# Patient Record
Sex: Male | Born: 1935 | Race: White | Hispanic: No | Marital: Married | Smoking: Former smoker
Health system: Southern US, Community
[De-identification: ages and names within clinical notes are randomized; demographics above are authoritative.]

## PROBLEM LIST (undated history)

## (undated) DIAGNOSIS — R030 Elevated blood-pressure reading, without diagnosis of hypertension: Secondary | ICD-10-CM

## (undated) DIAGNOSIS — E119 Type 2 diabetes mellitus without complications: Secondary | ICD-10-CM

## (undated) DIAGNOSIS — K219 Gastro-esophageal reflux disease without esophagitis: Secondary | ICD-10-CM

## (undated) DIAGNOSIS — Z8711 Personal history of peptic ulcer disease: Secondary | ICD-10-CM

## (undated) DIAGNOSIS — I10 Essential (primary) hypertension: Secondary | ICD-10-CM

## (undated) DIAGNOSIS — Z8719 Personal history of other diseases of the digestive system: Secondary | ICD-10-CM

## (undated) DIAGNOSIS — R930 Abnormal findings on diagnostic imaging of skull and head, not elsewhere classified: Secondary | ICD-10-CM

## (undated) DIAGNOSIS — F329 Major depressive disorder, single episode, unspecified: Secondary | ICD-10-CM

## (undated) DIAGNOSIS — E785 Hyperlipidemia, unspecified: Secondary | ICD-10-CM

## (undated) DIAGNOSIS — R3129 Other microscopic hematuria: Secondary | ICD-10-CM

## (undated) HISTORY — DX: Abnormal findings on diagnostic imaging of skull and head, not elsewhere classified: R93.0

## (undated) HISTORY — DX: Elevated blood-pressure reading, without diagnosis of hypertension: R03.0

## (undated) HISTORY — DX: Personal history of other diseases of the digestive system: Z87.19

## (undated) HISTORY — DX: Gastro-esophageal reflux disease without esophagitis: K21.9

## (undated) HISTORY — DX: Hyperlipidemia, unspecified: E78.5

## (undated) HISTORY — DX: Other microscopic hematuria: R31.29

## (undated) HISTORY — PX: EYE SURGERY: SHX253

## (undated) HISTORY — DX: Personal history of peptic ulcer disease: Z87.11

## (undated) HISTORY — DX: Major depressive disorder, single episode, unspecified: F32.9

## (undated) HISTORY — DX: Type 2 diabetes mellitus without complications: E11.9

---

## 2008-11-09 ENCOUNTER — Ambulatory Visit: Payer: Self-pay | Admitting: Family Medicine

## 2008-11-09 DIAGNOSIS — F329 Major depressive disorder, single episode, unspecified: Secondary | ICD-10-CM

## 2008-11-09 DIAGNOSIS — F3289 Other specified depressive episodes: Secondary | ICD-10-CM

## 2008-11-09 DIAGNOSIS — IMO0002 Reserved for concepts with insufficient information to code with codable children: Secondary | ICD-10-CM | POA: Insufficient documentation

## 2008-11-09 DIAGNOSIS — E1165 Type 2 diabetes mellitus with hyperglycemia: Secondary | ICD-10-CM | POA: Insufficient documentation

## 2008-11-09 DIAGNOSIS — E119 Type 2 diabetes mellitus without complications: Secondary | ICD-10-CM

## 2008-11-09 HISTORY — DX: Other specified depressive episodes: F32.89

## 2008-11-09 HISTORY — DX: Major depressive disorder, single episode, unspecified: F32.9

## 2008-11-09 HISTORY — DX: Type 2 diabetes mellitus without complications: E11.9

## 2008-11-10 ENCOUNTER — Telehealth: Payer: Self-pay | Admitting: *Deleted

## 2008-11-10 LAB — CONVERTED CEMR LAB: Hgb A1c MFr Bld: 6.3 % (ref 4.6–6.5)

## 2009-02-09 ENCOUNTER — Ambulatory Visit: Payer: Self-pay | Admitting: Family Medicine

## 2009-02-14 LAB — CONVERTED CEMR LAB
Creatinine,U: 61 mg/dL
Hgb A1c MFr Bld: 6.7 % — ABNORMAL HIGH (ref 4.6–6.5)
Microalb Creat Ratio: 3.3 mg/g (ref 0.0–30.0)
Microalb, Ur: 0.2 mg/dL (ref 0.0–1.9)

## 2009-09-07 ENCOUNTER — Ambulatory Visit: Payer: Self-pay | Admitting: Family Medicine

## 2009-09-07 LAB — HM DIABETES FOOT EXAM

## 2009-09-10 LAB — CONVERTED CEMR LAB: Hgb A1c MFr Bld: 6.6 % — ABNORMAL HIGH (ref 4.6–6.5)

## 2009-11-16 ENCOUNTER — Ambulatory Visit (HOSPITAL_COMMUNITY): Admission: RE | Admit: 2009-11-16 | Discharge: 2009-11-16 | Payer: Self-pay | Admitting: Family Medicine

## 2009-11-16 ENCOUNTER — Ambulatory Visit: Payer: Self-pay | Admitting: Family Medicine

## 2009-11-16 DIAGNOSIS — R05 Cough: Secondary | ICD-10-CM

## 2009-11-16 DIAGNOSIS — R3129 Other microscopic hematuria: Secondary | ICD-10-CM | POA: Insufficient documentation

## 2009-11-16 DIAGNOSIS — R059 Cough, unspecified: Secondary | ICD-10-CM | POA: Insufficient documentation

## 2009-11-16 DIAGNOSIS — R300 Dysuria: Secondary | ICD-10-CM | POA: Insufficient documentation

## 2009-11-16 DIAGNOSIS — J13 Pneumonia due to Streptococcus pneumoniae: Secondary | ICD-10-CM | POA: Insufficient documentation

## 2009-11-16 HISTORY — DX: Other microscopic hematuria: R31.29

## 2009-11-16 LAB — CONVERTED CEMR LAB
Nitrite: NEGATIVE
Specific Gravity, Urine: 1.015
WBC Urine, dipstick: NEGATIVE
pH: 5

## 2009-11-17 LAB — CONVERTED CEMR LAB
Basophils Absolute: 0 10*3/uL (ref 0.0–0.1)
Lymphocytes Relative: 10.1 % — ABNORMAL LOW (ref 12.0–46.0)
Lymphs Abs: 1.3 10*3/uL (ref 0.7–4.0)
Monocytes Relative: 11.8 % (ref 3.0–12.0)
Platelets: 230 10*3/uL (ref 150.0–400.0)
RDW: 15 % — ABNORMAL HIGH (ref 11.5–14.6)

## 2009-11-19 ENCOUNTER — Ambulatory Visit: Payer: Self-pay | Admitting: Family Medicine

## 2009-11-19 LAB — CONVERTED CEMR LAB
Bilirubin Urine: NEGATIVE
Glucose, Urine, Semiquant: NEGATIVE
Nitrite: NEGATIVE
Urobilinogen, UA: 0.2
WBC Urine, dipstick: NEGATIVE
pH: 5

## 2009-12-10 ENCOUNTER — Encounter: Payer: Self-pay | Admitting: Family Medicine

## 2009-12-10 DIAGNOSIS — R930 Abnormal findings on diagnostic imaging of skull and head, not elsewhere classified: Secondary | ICD-10-CM

## 2009-12-10 HISTORY — DX: Abnormal findings on diagnostic imaging of skull and head, not elsewhere classified: R93.0

## 2009-12-13 ENCOUNTER — Ambulatory Visit: Payer: Self-pay | Admitting: Family Medicine

## 2010-02-25 ENCOUNTER — Encounter: Payer: Self-pay | Admitting: *Deleted

## 2010-05-17 ENCOUNTER — Other Ambulatory Visit: Payer: Self-pay | Admitting: Family Medicine

## 2010-05-17 ENCOUNTER — Ambulatory Visit
Admission: RE | Admit: 2010-05-17 | Discharge: 2010-05-17 | Payer: Self-pay | Source: Home / Self Care | Attending: Family Medicine | Admitting: Family Medicine

## 2010-05-17 LAB — BASIC METABOLIC PANEL
BUN: 18 mg/dL (ref 6–23)
CO2: 28 mEq/L (ref 19–32)
Calcium: 9.7 mg/dL (ref 8.4–10.5)
Chloride: 105 mEq/L (ref 96–112)
Creatinine, Ser: 1.1 mg/dL (ref 0.4–1.5)
GFR: 68.16 mL/min (ref >60.00)
Glucose, Bld: 133 mg/dL — ABNORMAL HIGH (ref 70–99)
Potassium: 5.1 mEq/L (ref 3.5–5.1)
Sodium: 141 mEq/L (ref 135–145)

## 2010-05-17 LAB — LIPID PANEL
Cholesterol: 185 mg/dL (ref 0–200)
HDL: 37.5 mg/dL — ABNORMAL LOW (ref >39.00)
LDL Cholesterol: 116 mg/dL — ABNORMAL HIGH (ref 0–99)
Total CHOL/HDL Ratio: 5
Triglycerides: 156 mg/dL — ABNORMAL HIGH (ref 0.0–149.0)
VLDL: 31.2 mg/dL (ref 0.0–40.0)

## 2010-05-17 LAB — URINALYSIS
Bilirubin Urine: NEGATIVE
Hemoglobin, Urine: NEGATIVE
Ketones, ur: NEGATIVE
Leukocytes, UA: NEGATIVE
Nitrite: NEGATIVE
Specific Gravity, Urine: 1.02 (ref 1.000–1.030)
Total Protein, Urine: NEGATIVE
Urine Glucose: NEGATIVE
Urobilinogen, UA: 0.2 (ref 0.0–1.0)
pH: 6.5 (ref 5.0–8.0)

## 2010-05-17 LAB — HEPATIC FUNCTION PANEL
ALT: 25 U/L (ref 0–53)
AST: 24 U/L (ref 0–37)
Albumin: 4.4 g/dL (ref 3.5–5.2)
Alkaline Phosphatase: 86 U/L (ref 39–117)
Bilirubin, Direct: 0.2 mg/dL (ref 0.0–0.3)
Total Bilirubin: 1.2 mg/dL (ref 0.3–1.2)
Total Protein: 7.4 g/dL (ref 6.0–8.3)

## 2010-05-17 LAB — CBC WITH DIFFERENTIAL/PLATELET
Basophils Absolute: 0 10*3/uL (ref 0.0–0.1)
Basophils Relative: 0.5 % (ref 0.0–3.0)
Eosinophils Absolute: 0.1 10*3/uL (ref 0.0–0.7)
Eosinophils Relative: 1.3 % (ref 0.0–5.0)
HCT: 45.7 % (ref 39.0–52.0)
Hemoglobin: 15.8 g/dL (ref 13.0–17.0)
Lymphocytes Relative: 25.9 % (ref 12.0–46.0)
Lymphs Abs: 2.3 10*3/uL (ref 0.7–4.0)
MCHC: 34.5 g/dL (ref 30.0–36.0)
MCV: 92.9 fl (ref 78.0–100.0)
Monocytes Absolute: 0.7 10*3/uL (ref 0.1–1.0)
Monocytes Relative: 8.3 % (ref 3.0–12.0)
Neutro Abs: 5.7 10*3/uL (ref 1.4–7.7)
Neutrophils Relative %: 64 % (ref 43.0–77.0)
Platelets: 235 10*3/uL (ref 150.0–400.0)
RBC: 4.92 Mil/uL (ref 4.22–5.81)
RDW: 13.7 % (ref 11.5–14.6)
WBC: 8.9 10*3/uL (ref 4.5–10.5)

## 2010-05-17 LAB — PSA: PSA: 9.93 ng/mL — ABNORMAL HIGH (ref 0.10–4.00)

## 2010-05-17 LAB — TSH: TSH: 5 u[IU]/mL (ref 0.35–5.50)

## 2010-05-24 ENCOUNTER — Ambulatory Visit
Admission: RE | Admit: 2010-05-24 | Discharge: 2010-05-24 | Payer: Self-pay | Source: Home / Self Care | Attending: Family Medicine | Admitting: Family Medicine

## 2010-05-24 DIAGNOSIS — E785 Hyperlipidemia, unspecified: Secondary | ICD-10-CM

## 2010-05-24 HISTORY — DX: Hyperlipidemia, unspecified: E78.5

## 2010-05-27 ENCOUNTER — Telehealth: Payer: Self-pay | Admitting: Family Medicine

## 2010-05-28 NOTE — Assessment & Plan Note (Signed)
Summary: ROA/FUP/AS PER PT INSTRUCTION SHEET/RCD   Vital Signs:  Patient profile:   77 year old male Weight:      165 pounds Temp:     98.6 degrees F oral BP sitting:   124 / 70  (left arm) Cuff size:   regular CC: fu per instructions, src   History of Present Illness: Patient presented late last week with chills, malaise, and body sweats. No fever. Clinically determined that he had likely community-acquired pneumonia left lower lobe. This was picked up on exam. Chest x-ray confirmed also had elevated white count with elevated neutrophils. Patient on Avelox 400 mg daily and feels some better. No fever. Chills are resolving. Good fluid intake. No nausea or vomiting.  Had some hematuria on dipstick last week but no pyuria. No gross hematuria by history. No history of kidney stones.  Current Medications (verified): 1)  Glimepiride 4 Mg Tabs (Glimepiride) .... Once Daily 2)  Aspirin 81 Mg Tbec (Aspirin) .... Once Daily 3)  Tagamet Hb 200 Mg Tabs (Cimetidine) .... Once Daily 4)  Centrum Silver  Tabs (Multiple Vitamins-Minerals) .... Once Daily 5)  Cod Liver Oil  Caps (Cod Liver Oil) .... Once Daily 6)  Miralax  Powd (Polyethylene Glycol 3350) .... Daily  Allergies (verified): No Known Drug Allergies  Past History:  Past Medical History: Last updated: 11/09/2008 Depression Type 2 DM  Physical Exam  General:  Well-developed,well-nourished,in no acute distress; alert,appropriate and cooperative throughout examination Ears:  External ear exam shows no significant lesions or deformities.  Otoscopic examination reveals clear canals, tympanic membranes are intact bilaterally without bulging, retraction, inflammation or discharge. Hearing is grossly normal bilaterally. Mouth:  Oral mucosa and oropharynx without lesions or exudates.  Teeth in good repair. Neck:  No deformities, masses, or tenderness noted. Lungs:  patient has more pronounced crackles and rales left base today right side  is clear. No wheezing Heart:  normal rate and regular rhythm.   Extremities:  no edema Neurologic:  alert & oriented X3.     Impression & Recommendations:  Problem # 1:  PNEUMONIA, COMMUNITY ACQUIRED, PNEUMOCOCCAL (ICD-481) Assessment Improved will order repeat CXR in 3-4 weeks to reassess.  Clinically some better today.  Problem # 2:  MICROSCOPIC HEMATURIA (ICD-599.72) repeat urine dipstick again today reveals small blood. His significant other works in urology office and she will have repeat urine done in 2 weeks and if persistent at that point urology referral  Complete Medication List: 1)  Glimepiride 4 Mg Tabs (Glimepiride) .... Once daily 2)  Aspirin 81 Mg Tbec (Aspirin) .... Once daily 3)  Tagamet Hb 200 Mg Tabs (Cimetidine) .... Once daily 4)  Centrum Silver Tabs (Multiple vitamins-minerals) .... Once daily 5)  Cod Liver Oil Caps (Cod liver oil) .... Once daily 6)  Miralax Powd (Polyethylene glycol 3350) .... Daily  Patient Instructions: 1)  Repeat urinalysis in 2 weeks. 2)  We will schedule follow up CXR in 3-4 weeks.  Laboratory Results   Urine Tests  Date/Time Received: November 19, 2009   Routine Urinalysis   Color: yellow Appearance: Clear Glucose: negative   (Normal Range: Negative) Bilirubin: negative   (Normal Range: Negative) Ketone: negative   (Normal Range: Negative) Spec. Gravity: >=1.030   (Normal Range: 1.003-1.035) Blood: small   (Normal Range: Negative) pH: 5.0   (Normal Range: 5.0-8.0) Protein: negative   (Normal Range: Negative) Urobilinogen: 0.2   (Normal Range: 0-1) Nitrite: negative   (Normal Range: Negative) Leukocyte Esterace: negative   (Normal Range:  Negative)    Comments: Kathrynn Speed CMA  November 19, 2009 5:26 PM

## 2010-05-28 NOTE — Assessment & Plan Note (Signed)
Summary: body aches/sweats/chills/cjr   Vital Signs:  Patient profile:   77 year old male Weight:      167 pounds O2 Sat:      99 % on Room air Temp:     98.1 degrees F oral Pulse rate:   74 / minute Pulse rhythm:   regular BP sitting:   130 / 80  (left arm) Cuff size:   regular  Vitals Entered By: Sid Falcon LPN (November 16, 2009 9:47 AM)  O2 Flow:  Room air CC: body aches, sweats, chills   History of Present Illness: Same-day appointment. Onset 2 days ago of body sweats, chills, body aches and decreased appetite. No definite fever. Had some mostly nonproductive cough. No recent headache or sore throat. No nasal congestion. Has noticed some urine frequency and foul odor to urine. No burning with urination. Generalized weakness.  Type 2 diabetes and blood sugars have been somewhat elevated past couple days. He had a couple of readings over 200 which is unusual for him. Recent A1c 6.6%.  Allergies (verified): No Known Drug Allergies  Past History:  Past Medical History: Last updated: 11/09/2008 Depression Type 2 DM  Family History: Last updated: 11/09/2008 unrevealing  Social History: Last updated: 11/09/2008 Retired Married Alcohol use-yes Past smoker  Risk Factors: Exercise: yes (09/07/2009) PMH-FH-SH reviewed for relevance  Review of Systems       The patient complains of anorexia.  The patient denies weight loss, chest pain, syncope, dyspnea on exertion, peripheral edema, prolonged cough, hemoptysis, abdominal pain, hematochezia, severe indigestion/heartburn, hematuria, incontinence, and suspicious skin lesions.    Physical Exam  General:  Well-developed,well-nourished,in no acute distress; alert,appropriate and cooperative throughout examination Eyes:  nonicteric. Ears:  External ear exam shows no significant lesions or deformities.  Otoscopic examination reveals clear canals, tympanic membranes are intact bilaterally without bulging, retraction,  inflammation or discharge. Hearing is grossly normal bilaterally. Nose:  External nasal examination shows no deformity or inflammation. Nasal mucosa are pink and moist without lesions or exudates. Mouth:  Oral mucosa and oropharynx without lesions or exudates.  Teeth in good repair. Neck:  No deformities, masses, or tenderness noted. Lungs:  patient has rales left base which are a new finding. No wheezes or retractions. Heart:  normal rate and regular rhythm.   Abdomen:  Bowel sounds positive,abdomen soft and non-tender without masses, organomegaly or hernias noted. Extremities:  no edema Neurologic:  alert & oriented X3 and cranial nerves II-XII intact.   Skin:  no rash Cervical Nodes:  No lymphadenopathy noted   Impression & Recommendations:  Problem # 1:  COUGH (ICD-786.2)  clinically suspicious left lower lobe pneumonia. Obtain CBC and chest x-ray and will start Avelox 400 mg daily for 10 days with samples given  Orders: T-2 View CXR (71020TC) Venipuncture ([address]) Specimen Handling (53664) TLB-CBC Platelet - w/Differential (85025-CBCD)  Problem # 2:  DYSURIA (ICD-788.1) pt has hematuria but no pyuria.  UTI unlikely.  Will need follow up UA to make sure blood cells clearing. Orders: UA Dipstick w/o Micro (manual) (40347)  Problem # 3:  DIAB W/O COMP TYPE II/UNS NOT STATED UNCNTRL (ICD-250.00) possibly exacerbated sec to PNA. His updated medication list for this problem includes:    Glimepiride 4 Mg Tabs (Glimepiride) ..... Once daily    Aspirin 81 Mg Tbec (Aspirin) ..... Once daily  Problem # 4:  MICROSCOPIC HEMATURIA (ICD-599.72) follow up next week and repeat then.  Complete Medication List: 1)  Glimepiride 4 Mg Tabs (Glimepiride) .... Once  daily 2)  Aspirin 81 Mg Tbec (Aspirin) .... Once daily 3)  Tagamet Hb 200 Mg Tabs (Cimetidine) .... Once daily 4)  Centrum Silver Tabs (Multiple vitamins-minerals) .... Once daily 5)  Cod Liver Oil Caps (Cod liver oil) .... Once  daily 6)  Miralax Powd (Polyethylene glycol 3350) .... Daily  Patient Instructions: 1)  start Avelox 400 mg one tablet daily 2)  Drink plenty of fluids 3)  Follow up immediately if you develop any increased shortness of breath, recurrent vomiting, or any other worsening symptoms 4)  Schedule followup visit in 3 days  Laboratory Results   Urine Tests    Routine Urinalysis   Color: orange Glucose: negative   (Normal Range: Negative) Bilirubin: negative   (Normal Range: Negative) Ketone: negative   (Normal Range: Negative) Spec. Gravity: 1.015   (Normal Range: 1.003-1.035) Blood: moderate   (Normal Range: Negative) pH: 5.0   (Normal Range: 5.0-8.0) Protein: negative   (Normal Range: Negative) Urobilinogen: 0.2   (Normal Range: 0-1) Nitrite: negative   (Normal Range: Negative) Leukocyte Esterace: negative   (Normal Range: Negative)    Comments: Sid Falcon LPN  November 16, 2009 10:26 AM

## 2010-05-28 NOTE — Miscellaneous (Signed)
Summary: Orders Update  Clinical Lists Changes  Problems: Added new problem of ABNORMAL CHEST XRAY (ICD-793.1) Orders: Added new Test order of T-2 View CXR (71020TC) - Signed  Appended Document: Orders Update Message left on pt personally identified home VM to have his 4 week repeat CXR done today.  Appended Document: Orders Update Spoke with pt, he will have this done.

## 2010-05-28 NOTE — Miscellaneous (Signed)
Summary: Immunization preload, Flu A [facility]   Immunization History:  Influenza Immunization History:    Influenza:  historical (02/24/2010)

## 2010-05-28 NOTE — Assessment & Plan Note (Signed)
Summary: fu on dm/njr   Vital Signs:  Patient profile:   77 year old male Weight:      170 pounds Temp:     98.2 degrees F oral BP sitting:   120 / 70  (left arm) Cuff size:   regular  Vitals Entered By: Sid Falcon LPN (Sep 07, 2009 2:44 PM) CC: DM follow-up Is Patient Diabetic? Yes Did you bring your meter with you today? Yes   History of Present Illness: Followup type 2 diabetes. Log of home readings reviewed. Mostly low 100s. No hypoglycemic symptoms. Amaryl 4 mg daily. States quite active with yard work and gardening.  Diabetes Management History:      He has not been enrolled in the "Diabetic Education Program".  He states understanding of dietary principles and is following his diet appropriately.  No sensory loss is reported.  Self foot exams are being performed.  He is checking home blood sugars.  He says that he is exercising.        Hypoglycemic symptoms are not occurring.  No hyperglycemic symptoms are reported.        There are no symptoms to suggest diabetic complications.  No changes have been made to his treatment plan since last visit.    Preventive Screening-Counseling & Management  Caffeine-Diet-Exercise     Does Patient Exercise: yes  Allergies (verified): No Known Drug Allergies  Social History: Does Patient Exercise:  yes  Review of Systems  The patient denies anorexia, weight loss, weight gain, chest pain, syncope, dyspnea on exertion, and abdominal pain.    Diabetes Management Exam:    Foot Exam (with socks and/or shoes not present):       Sensory-Pinprick/Light touch:          Left medial foot (L-4): normal          Left dorsal foot (L-5): normal          Left lateral foot (S-1): normal          Right medial foot (L-4): normal          Right dorsal foot (L-5): normal          Right lateral foot (S-1): normal       Sensory-Monofilament:          Left foot: normal          Right foot: normal       Inspection:          Left foot: normal         Right foot: normal       Nails:          Left foot: normal          Right foot: normal    Eye Exam:       Eye Exam done elsewhere          Date: 06/27/2009          Results: normal   Impression & Recommendations:  Problem # 1:  DIAB W/O COMP TYPE II/UNS NOT STATED UNCNTRL (ICD-250.00)  His updated medication list for this problem includes:    Glimepiride 4 Mg Tabs (Glimepiride) ..... Once daily    Aspirin 81 Mg Tbec (Aspirin) ..... Once daily  Orders: Prescription Created Electronically [phone]) Venipuncture [phone]) TLB-A1C / Hgb A1C (Glycohemoglobin) (83036-A1C)  Complete Medication List: 1)  Glimepiride 4 Mg Tabs (Glimepiride) .... Once daily 2)  Aspirin 81 Mg Tbec (Aspirin) .... Once daily 3)  Tagamet Hb 200  Mg Tabs (Cimetidine) .... Once daily 4)  Centrum Silver Tabs (Multiple vitamins-minerals) .... Once daily 5)  Cod Liver Oil Caps (Cod liver oil) .... Once daily 6)  Miralax Powd (Polyethylene glycol 3350) .... Daily  Patient Instructions: 1)  It is important that you exercise reguarly at least 20 minutes 5 times a week. If you develop chest pain, have severe difficulty breathing, or feel very tired, stop exercising immediately and seek medical attention.  2)  Check your blood sugars regularly. If your readings are usually above: 140 or below 70 you should contact our office.  3)  It is important that your diabetic A1c level is checked every 3 months.  4)  See your eye doctor yearly to check for diabetic eye damage. 5)  Check your feet each night  for sore areas, calluses or signs of infection.  Prescriptions: GLIMEPIRIDE 4 MG TABS (GLIMEPIRIDE) once daily  #30 x 11   Entered and Authorized by:   Evelena Peat MD   Signed by:   Evelena Peat MD on 09/07/2009   Method used:   [facility]ronically to        [phone][facility] (retail)       [address]       [place], [place]  [address]       Ph: [phone] or [phone]       Fax: [phone]   RxID:    [id number]

## 2010-05-30 NOTE — Assessment & Plan Note (Signed)
Summary: CPX//SLM   Vital Signs:  Patient profile:   77 year old male Height:      69 inches Weight:      175 pounds Temp:     98.0 degrees F oral BP sitting:   130 / 88  (left arm) Cuff size:   regular  Vitals Entered By: Sid Falcon LPN (May 24, 2010 3:30 PM)  History of Present Illness: Patient here for complete physical.  He has history of type 2 diabetes and past history of depression which is currently stable off medication. Takes glymepiride 4 mg daily for diabetes. Blood sugars been stable. No symptoms of hyperglycemia. does have some occasional nocturia and slow stream but is not interested in further medications. No burning with urination.  No history of prior colonoscopy screening. He is not interested in further colonoscopy but does agree to Hemoccult. Tetanus up-to-date. Flu vaccine given. Prior Pneumovax.  Clinical Review Panels:  Immunizations   Last Tetanus Booster:  Historical (04/28/2005)   Last Flu Vaccine:  Historical (02/24/2010)   Last Pneumovax:  Historical (04/28/2005)   Allergies (verified): No Known Drug Allergies  Past History:  Family History: Last updated: 11/09/2008 unrevealing  Social History: Last updated: 05/24/2010 Retired Married Alcohol use-yes Past smoker, quit age 10  Risk Factors: Exercise: yes (09/07/2009)  Past Medical History: Depression Type 2 DM Hyperlipidemia  Past Surgical History: none PMH-FH-SH reviewed for relevance  Social History: Retired Married Alcohol use-yes Past smoker, quit age 24  Review of Systems  The patient denies anorexia, fever, weight loss, weight gain, vision loss, decreased hearing, hoarseness, chest pain, syncope, dyspnea on exertion, peripheral edema, prolonged cough, headaches, hemoptysis, abdominal pain, melena, hematochezia, severe indigestion/heartburn, hematuria, incontinence, genital sores, muscle weakness, suspicious skin lesions, transient blindness, difficulty walking,  depression, unusual weight change, abnormal bleeding, enlarged lymph nodes, and testicular masses.    Physical Exam  General:  Well-developed,well-nourished,in no acute distress; alert,appropriate and cooperative throughout examination Head:  normocephalic and atraumatic.   Eyes:  No corneal or conjunctival inflammation noted. EOMI. Perrla. Funduscopic exam benign, without hemorrhages, exudates or papilledema. Vision grossly normal. Ears:  External ear exam shows no significant lesions or deformities.  Otoscopic examination reveals clear canals, tympanic membranes are intact bilaterally without bulging, retraction, inflammation or discharge. Hearing is grossly normal bilaterally. Mouth:  Oral mucosa and oropharynx without lesions or exudates.  Teeth in good repair. Neck:  No deformities, masses, or tenderness noted. Lungs:  Normal respiratory effort, chest expands symmetrically. Lungs are clear to auscultation, no crackles or wheezes. Heart:  normal rate and regular rhythm.   Abdomen:  Bowel sounds positive,abdomen soft and non-tender without masses, organomegaly or hernias noted. Prostate:  pt refuses Msk:  No deformity or scoliosis noted of thoracic or lumbar spine.   Extremities:  No clubbing, cyanosis, edema, or deformity noted with normal full range of motion of all joints.   Neurologic:  alert & oriented X3, cranial nerves II-XII intact, and strength normal in all extremities.   Skin:  no rashes and no suspicious lesions.   Cervical Nodes:  No lymphadenopathy noted Psych:  Cognition and judgment appear intact. Alert and cooperative with normal attention span and concentration. No apparent delusions, illusions, hallucinations   Impression & Recommendations:  Problem # 1:  Preventive Health Care (ICD-V70.0) labs reviewed with pt.  He has elevated PSA.  This was drawn with his labs and we did not have an opportunity to discuss wtih  him in advance pros and cons of PSA  testing in 77 year  old.  He is fully aware this could represent cancer or benign cause such as BPH.  After long discussion he has decided not to have any evaluation as he would not be interested in any  treatment.  He will notify us if he changes his mind.  I would not rec any other PSA testing at his age.  Problem # 2:  DIAB W/O COMP TYPE II/UNS NOT STATED UNCNTRL (ICD-250.00)  His updated medication list for this problem includes:    Glimepiride 4 Mg Tabs (Glimepiride) ..... Once daily    Aspirin 81 Mg Tbec (Aspirin) ..... Once daily  Problem # 3:  HYPERLIPIDEMIA (ICD-272.4) initiate lipitor in this diabetic with LDL 116 and f/u labs scheduled. His updated medication list for this problem includes:    Lipitor 10 Mg Tabs (Atorvastatin calcium) ..... One by mouth once daily  Complete Medication List: 1)  Glimepiride 4 Mg Tabs (Glimepiride) .... Once daily 2)  Aspirin 81 Mg Tbec (Aspirin) .... Once daily 3)  Tagamet Hb 200 Mg Tabs (Cimetidine) .... Once daily 4)  Centrum Silver Tabs (Multiple vitamins-minerals) .... Once daily 5)  Cod Liver Oil Caps (Cod liver oil) .... Once daily 6)  Miralax Powd (Polyethylene glycol 3350) .... Daily 7)  Lipitor 10 Mg Tabs (Atorvastatin calcium) .... One by mouth once daily  Patient Instructions: 1)  Please schedule a follow-up appointment in 2 months.  2)  See your eye doctor yearly to check for diabetic eye damage. 3)  Check your feet each night  for sore areas, calluses or signs of infection.  4)  Hepatic Panel prior to visit ICD-9: 272.4 5)  Lipid panel prior to visit ICD-9 : 272.4 6)  HgBA1c prior to visit  ICD-9: 250.00 Prescriptions: LIPITOR 10 MG TABS (ATORVASTATIN CALCIUM) one by mouth once daily  #30 x 5   Entered and Authorized by:   Evelena Peat MD   Signed by:   Evelena Peat MD on 05/24/2010   Method used:   [facility] to        [facility] [phone]* (retail)       [address]       [place], [place]  [address]       Ph: [phone] or  [phone]       Fax: [phone]   RxID:   [id number]    Orders Added: 1)  Est. Patient 65& > [[address]]

## 2010-06-05 NOTE — Progress Notes (Signed)
  Phone Note Call from Patient Call back at Home Phone [phone]   Caller: Spouse Call For: Adrian Peat MD Reason for Call: Privacy/Consent Authorization Summary of Call: [facility] [place] [facility] Wife wants to know if they can use Pravastatin instead of generic Lipitor.   Price difference is 100.00.   Initial call taken by: Lynann Beaver CMA AAMA,  May 27, 2010 9:45 AM  Follow-up for Phone Call        yes.  Let's start Pravachol 20 mg daily and repeat lipids and hepatic 6-8 weeks. Follow-up by: Adrian Peat MD,  May 27, 2010 10:44 AM    New/Updated Medications: PRAVACHOL 20 MG TABS (PRAVASTATIN SODIUM) one by mouth daily Prescriptions: PRAVACHOL 20 MG TABS (PRAVASTATIN SODIUM) one by mouth daily  #90 x 0   Entered by:   Lynann Beaver CMA AAMA   Authorized by:   Adrian Peat MD   Signed by:   Lynann Beaver CMA AAMA on 05/27/2010   Method used:   [facility] to        [facility] [phone]* (retail)       [address]       [place], [place]  [address]       Ph: [phone] or [phone]       Fax: [phone]   RxID:   [id number]  Notified pt.

## 2010-06-21 ENCOUNTER — Other Ambulatory Visit (INDEPENDENT_AMBULATORY_CARE_PROVIDER_SITE_OTHER): Payer: Self-pay | Admitting: Family Medicine

## 2010-06-21 DIAGNOSIS — Z0000 Encounter for general adult medical examination without abnormal findings: Secondary | ICD-10-CM

## 2010-06-21 LAB — HEMOCCULT GUIAC POC 1CARD (OFFICE): Card #2 Fecal Occult Blod, POC: NEGATIVE

## 2010-06-26 NOTE — Progress Notes (Signed)
Quick Note:  Pt wife informed ______ 

## 2010-06-27 ENCOUNTER — Other Ambulatory Visit: Payer: Self-pay

## 2010-07-23 ENCOUNTER — Ambulatory Visit: Payer: Self-pay | Admitting: Family Medicine

## 2010-08-14 ENCOUNTER — Other Ambulatory Visit (INDEPENDENT_AMBULATORY_CARE_PROVIDER_SITE_OTHER): Payer: Self-pay | Admitting: Family Medicine

## 2010-08-14 DIAGNOSIS — Z0289 Encounter for other administrative examinations: Secondary | ICD-10-CM

## 2010-08-14 DIAGNOSIS — IMO0001 Reserved for inherently not codable concepts without codable children: Secondary | ICD-10-CM

## 2010-08-14 DIAGNOSIS — E785 Hyperlipidemia, unspecified: Secondary | ICD-10-CM

## 2010-08-14 DIAGNOSIS — T887XXA Unspecified adverse effect of drug or medicament, initial encounter: Secondary | ICD-10-CM

## 2010-08-14 LAB — LIPID PANEL
LDL Cholesterol: 83 mg/dL (ref 0–99)
Total CHOL/HDL Ratio: 4
VLDL: 38.4 mg/dL (ref 0.0–40.0)

## 2010-08-14 LAB — HEPATIC FUNCTION PANEL
ALT: 21 U/L (ref 0–53)
AST: 21 U/L (ref 0–37)
Alkaline Phosphatase: 81 U/L (ref 39–117)
Total Bilirubin: 0.8 mg/dL (ref 0.3–1.2)

## 2010-08-14 LAB — HEMOGLOBIN A1C: Hgb A1c MFr Bld: 7.2 % — ABNORMAL HIGH (ref 4.6–6.5)

## 2010-08-16 ENCOUNTER — Encounter: Payer: Self-pay | Admitting: Family Medicine

## 2010-08-21 ENCOUNTER — Encounter: Payer: Self-pay | Admitting: Family Medicine

## 2010-08-21 ENCOUNTER — Ambulatory Visit (INDEPENDENT_AMBULATORY_CARE_PROVIDER_SITE_OTHER): Payer: Self-pay | Admitting: Family Medicine

## 2010-08-21 DIAGNOSIS — E785 Hyperlipidemia, unspecified: Secondary | ICD-10-CM

## 2010-08-21 DIAGNOSIS — E119 Type 2 diabetes mellitus without complications: Secondary | ICD-10-CM

## 2010-08-21 MED ORDER — PRAVASTATIN SODIUM 20 MG PO TABS: 20.0000 mg | ORAL_TABLET | Freq: Every day | ORAL | Status: DC

## 2010-08-21 MED ORDER — GLIMEPIRIDE 4 MG PO TABS: 4.0000 mg | ORAL_TABLET | Freq: Every day | ORAL | Status: DC

## 2010-08-21 NOTE — Progress Notes (Signed)
  Subjective:    Patient ID: Adrian Wood, male    DOB: [birth year], 77 y.o.   MRN: [id number]  HPI Patient is seen for medical followup. Type 2 diabetes with recent A1c 7.2%. Takes Amaryl 4 mg daily. No hypoglycemic symptoms. No symptoms of hyperglycemia. Getting yearly eye exams.  Hyperlipidemia. Recently started Pravachol 20 mg daily. Tolerating without side effects. Recent lipids reviewed. LDL at goal of less than 100.   Review of Systems  Constitutional: Negative for appetite change and unexpected weight change.  Eyes: Negative for visual disturbance.  Respiratory: Negative for cough and shortness of breath.   Cardiovascular: Negative for chest pain, palpitations and leg swelling.  Gastrointestinal: Negative for abdominal pain and abdominal distention.  Genitourinary: Negative for dysuria.       Objective:   Physical Exam  Constitutional: He is oriented to person, place, and time. He appears well-developed and well-nourished. No distress.  Cardiovascular: Normal rate, regular rhythm and normal heart sounds.  Exam reveals no gallop.   Pulmonary/Chest: Effort normal and breath sounds normal. No respiratory distress. He has no wheezes. He has no rales.  Musculoskeletal: He exhibits no edema.       Feet reveal no lesions. Good distal foot pulses. Normal sensory function.  Neurological: He is alert and oriented to person, place, and time.  Psychiatric: He has a normal mood and affect.          Assessment & Plan:  #1 type 2 diabetes fair control. Reassess A1c 6 months. Continue yearly eye exams. Refill medication for one year #2 dyslipidemia. Continue Pravachol 20 mg daily. Reassess in 6 months

## 2011-02-13 ENCOUNTER — Other Ambulatory Visit (INDEPENDENT_AMBULATORY_CARE_PROVIDER_SITE_OTHER): Payer: Self-pay

## 2011-02-13 DIAGNOSIS — E785 Hyperlipidemia, unspecified: Secondary | ICD-10-CM

## 2011-02-13 DIAGNOSIS — IMO0001 Reserved for inherently not codable concepts without codable children: Secondary | ICD-10-CM

## 2011-02-13 LAB — HEPATIC FUNCTION PANEL
AST: 25 U/L (ref 0–37)
Albumin: 4.2 g/dL (ref 3.5–5.2)
Alkaline Phosphatase: 81 U/L (ref 39–117)
Bilirubin, Direct: 0.1 mg/dL (ref 0.0–0.3)
Total Bilirubin: 1.2 mg/dL (ref 0.3–1.2)

## 2011-02-13 LAB — LIPID PANEL
HDL: 39.1 mg/dL (ref >39.00)
LDL Cholesterol: 76 mg/dL (ref 0–99)
Total CHOL/HDL Ratio: 4
VLDL: 39.8 mg/dL (ref 0.0–40.0)

## 2011-02-19 ENCOUNTER — Encounter: Payer: Self-pay | Admitting: Family Medicine

## 2011-02-20 ENCOUNTER — Encounter: Payer: Self-pay | Admitting: Family Medicine

## 2011-02-20 ENCOUNTER — Ambulatory Visit (INDEPENDENT_AMBULATORY_CARE_PROVIDER_SITE_OTHER): Payer: Self-pay | Admitting: Family Medicine

## 2011-02-20 VITALS — BP 130/82 | Temp 98.1°F | Wt 173.0 lb

## 2011-02-20 DIAGNOSIS — M792 Neuralgia and neuritis, unspecified: Secondary | ICD-10-CM

## 2011-02-20 DIAGNOSIS — E785 Hyperlipidemia, unspecified: Secondary | ICD-10-CM

## 2011-02-20 DIAGNOSIS — E119 Type 2 diabetes mellitus without complications: Secondary | ICD-10-CM

## 2011-02-20 DIAGNOSIS — G4700 Insomnia, unspecified: Secondary | ICD-10-CM

## 2011-02-20 DIAGNOSIS — G5790 Unspecified mononeuropathy of unspecified lower limb: Secondary | ICD-10-CM

## 2011-02-20 MED ORDER — AMITRIPTYLINE HCL 10 MG PO TABS: 10.0000 mg | ORAL_TABLET | Freq: Every day | ORAL | Status: DC

## 2011-02-20 MED ORDER — PRAVASTATIN SODIUM 20 MG PO TABS: 20.0000 mg | ORAL_TABLET | Freq: Every day | ORAL | Status: DC

## 2011-02-20 MED ORDER — GLIMEPIRIDE 4 MG PO TABS: 4.0000 mg | ORAL_TABLET | Freq: Every day | ORAL | Status: DC

## 2011-02-20 NOTE — Progress Notes (Signed)
  Subjective:    Patient ID: Adrian Wood, male    DOB: 04/17/1927, 77 y.o.   MRN: [id number]  HPI  Medical followup. Patient has history of type 2 diabetes, past history of depression, hyperlipidemia. Medications reviewed. Compliant with all. Blood sugars mostly taken 2-3 hours postprandial at night and ranging anywhere from 110-200. Recent A1c 7.6%. No hypoglycemia. No symptoms of hyperglycemia. Stays quite active with physical activities at home. No visual changes.  Denies any recent chest pain. Lipids well controlled by recent lab work. These are reviewed with patient.  Bilateral leg pains mostly at night. Aching quality. Moderate severity. No pain with activity. No claudication symptoms. Also complains of some frequent insomnia. No late day use of caffeine. No regular consumption of alcohol.  Past Medical History  Diagnosis Date  . DIAB W/O COMP TYPE II/UNS NOT STATED UNCNTRL 11/09/2008  . DEPRESSION 11/09/2008  . Microscopic hematuria 11/16/2009  . Nonspecific (abnormal) findings on radiological and other examination of body structure 12/10/2009  . ABNORMAL CHEST XRAY 12/10/2009  . HYPERLIPIDEMIA 05/24/2010   No past surgical history on file.  reports that he quit smoking about 28 years ago. His smoking use included Cigarettes. He has a 30 pack-year smoking history. He does not have any smokeless tobacco history on file. His alcohol and drug histories not on file. family history is not on file. No Known Allergies    Review of Systems  Constitutional: Negative for fever, activity change, appetite change, fatigue and unexpected weight change.  HENT: Positive for congestion. Negative for trouble swallowing.   Eyes: Negative for pain and visual disturbance.  Respiratory: Negative for cough, shortness of breath and wheezing.   Cardiovascular: Negative for chest pain and palpitations.  Gastrointestinal: Negative for nausea, vomiting, abdominal pain, diarrhea, constipation, blood in stool,  abdominal distention and rectal pain.  Genitourinary: Negative for dysuria, hematuria and testicular pain.  Skin: Negative for rash.  Neurological: Negative for dizziness, syncope and headaches.  Hematological: Negative for adenopathy.  Psychiatric/Behavioral: Negative for confusion and dysphoric mood.       Objective:   Physical Exam  Constitutional: He is oriented to person, place, and time. He appears well-developed and well-nourished.  HENT:  Mouth/Throat: Oropharynx is clear and moist.  Neck: Neck supple. No thyromegaly present.  Cardiovascular: Normal rate and regular rhythm.   Pulmonary/Chest: Effort normal and breath sounds normal. No respiratory distress. He has no wheezes. He has no rales.  Musculoskeletal: He exhibits no edema.  Lymphadenopathy:    He has no cervical adenopathy.  Neurological: He is alert and oriented to person, place, and time.  Skin: No rash noted.  Psychiatric: He has a normal mood and affect. His behavior is normal.          Assessment & Plan:  #1 type 2 diabetes. A1c 7.6%. We discussed at his age not pushing for tighter control this time based on current data. Reassess A1c is 6 months #2 dyslipidemia. LDL at goal. Refill Pravachol for one year #3 neuropathic leg pain. Low dose Elavil 10 mg each bedtime which may help with insomnia as well #4 insomnia. Sleep hygiene discussed. Low dose Elavil as above with caution about raising dose given his age

## 2011-04-10 ENCOUNTER — Telehealth: Payer: Self-pay | Admitting: Family Medicine

## 2011-04-10 MED ORDER — HYDROCODONE-HOMATROPINE 5-1.5 MG/5ML PO SYRP: 5.0000 mL | ORAL_SOLUTION | Freq: Four times a day (QID) | ORAL | Status: AC | PRN

## 2011-04-10 NOTE — Telephone Encounter (Signed)
Pts wife called to check on status of getting a cough med called in to [facility] on [address] asap today.

## 2011-04-10 NOTE — Telephone Encounter (Signed)
Please advise 

## 2011-04-10 NOTE — Telephone Encounter (Signed)
Pt wife is aware waiting on MD °

## 2011-04-10 NOTE — Telephone Encounter (Signed)
Pts spouse called and said that her spouse has the same symptoms that she had. Cough, chest congestion, low grade fever. Since pt is diabetic, is there anything that pcp could call in to [facility] on [address].

## 2011-04-10 NOTE — Telephone Encounter (Signed)
Informed on home VM Rx called in

## 2011-04-10 NOTE — Telephone Encounter (Signed)
Hycodan cough syrup one tsp po q 6 hours prn cough, disp 120 ml with no refill.

## 2011-08-11 ENCOUNTER — Other Ambulatory Visit: Payer: Self-pay | Admitting: Family Medicine

## 2011-08-14 ENCOUNTER — Other Ambulatory Visit (INDEPENDENT_AMBULATORY_CARE_PROVIDER_SITE_OTHER): Payer: Self-pay

## 2011-08-14 DIAGNOSIS — E119 Type 2 diabetes mellitus without complications: Secondary | ICD-10-CM

## 2011-08-14 LAB — HEMOGLOBIN A1C: Hgb A1c MFr Bld: 7.7 % — ABNORMAL HIGH (ref 4.6–6.5)

## 2011-08-15 NOTE — Progress Notes (Signed)
Quick Note:  Pt informed, has appt next week ______

## 2011-08-21 ENCOUNTER — Ambulatory Visit (INDEPENDENT_AMBULATORY_CARE_PROVIDER_SITE_OTHER): Payer: Self-pay | Admitting: Family Medicine

## 2011-08-21 ENCOUNTER — Encounter: Payer: Self-pay | Admitting: Family Medicine

## 2011-08-21 VITALS — BP 136/76 | Temp 97.4°F | Wt 174.0 lb

## 2011-08-21 DIAGNOSIS — L603 Nail dystrophy: Secondary | ICD-10-CM

## 2011-08-21 DIAGNOSIS — L608 Other nail disorders: Secondary | ICD-10-CM

## 2011-08-21 DIAGNOSIS — E119 Type 2 diabetes mellitus without complications: Secondary | ICD-10-CM

## 2011-08-21 DIAGNOSIS — IMO0001 Reserved for inherently not codable concepts without codable children: Secondary | ICD-10-CM

## 2011-08-21 DIAGNOSIS — M791 Myalgia, unspecified site: Secondary | ICD-10-CM

## 2011-08-21 NOTE — Progress Notes (Signed)
  Subjective:    Patient ID: Adrian Wood, male    DOB: 05-01-1926, 78 y.o.   MRN: [id number]  HPI  Patient for followup of type 2 diabetes. Takes glimepiride 4 mg daily. Fasting blood sugars fairly well controlled. 2 hour postprandials very but usually less than 180. Recent A1c 7.7%. No hypoglycemia. He stays quite active with exercise and activities around the house.  Intermittent bilateral leg aches and muscle aches. No claudication symptoms. Symptoms occur at rest. Question pravastatin related. Denies any specific arthralgias.  Dystrophic changes left great toenail. Noted since trauma several years ago. Wife does try several topical treatments to no avail. No symptoms.  Past Medical History  Diagnosis Date  . DIAB W/O COMP TYPE II/UNS NOT STATED UNCNTRL 11/09/2008  . DEPRESSION 11/09/2008  . Microscopic hematuria 11/16/2009  . Nonspecific (abnormal) findings on radiological and other examination of body structure 12/10/2009  . ABNORMAL CHEST XRAY 12/10/2009  . HYPERLIPIDEMIA 05/24/2010   No past surgical history on file.  reports that he quit smoking about 29 years ago. His smoking use included Cigarettes. He has a 30 pack-year smoking history. He does not have any smokeless tobacco history on file. His alcohol and drug histories not on file. family history is not on file. No Known Allergies   Review of Systems  Constitutional: Negative for appetite change.  HENT: Negative for trouble swallowing and voice change.   Eyes: Negative for visual disturbance.  Respiratory: Negative for cough and shortness of breath.   Cardiovascular: Negative for chest pain, palpitations and leg swelling.  Gastrointestinal: Negative for abdominal pain.  Musculoskeletal: Positive for myalgias. Negative for arthralgias.  Neurological: Negative for dizziness, weakness and headaches.  Hematological: Negative for adenopathy.       Objective:   Physical Exam  Constitutional: He appears well-developed and  well-nourished.  HENT:  Mouth/Throat: Oropharynx is clear and moist.  Neck: Neck supple. No thyromegaly present.  Cardiovascular: Normal rate and regular rhythm.   Pulmonary/Chest: Effort normal and breath sounds normal. No respiratory distress. He has no wheezes. He has no rales.  Musculoskeletal: He exhibits no edema.       Left great toe reveals dystrophic changes with brittle nail  Lymphadenopathy:    He has no cervical adenopathy.          Assessment & Plan:  #1 type 2 diabetes. Marginal control. Discussed options they wish to wait at this point. Recheck A1c in 6 months. If elevating the hhe may still be a candidate for addition of metformin. Has never been on this previously and does not have any major kidney problems  #2 Dystrophic left great toe nail. Suspect onychomycosis. Discussed options including Lamisil at this point they wish to wait. If interested in 6 months liver panel then #3 myalgias bilateral legs. Question statin related. Trial off pravastatin for 3-4 weeks with myalgias resolve he'll be in touch otherwise they will go back on in one month if myalgias not resolving.

## 2011-08-21 NOTE — Patient Instructions (Signed)
Hold Pravastatin for now and be in touch in 3-4 weeks if leg pains improve

## 2011-09-18 ENCOUNTER — Telehealth: Payer: Self-pay | Admitting: Family Medicine

## 2011-09-18 NOTE — Telephone Encounter (Signed)
Would go ahead and start back pravastatin if no improvement in symptoms off this medication for one month

## 2011-09-18 NOTE — Telephone Encounter (Signed)
Patient called stating that the md took him off pravastatin and told him to call within a month to see if he should continue again. Please advise. This is concerning the pains in his legs.

## 2011-09-18 NOTE — Telephone Encounter (Signed)
Pt wife informed

## 2011-09-18 NOTE — Telephone Encounter (Signed)
i called pt re: bil leg discomfort, he reports he has noted no improvement, symptoms with his legs is ongoing.

## 2011-10-02 LAB — HM DIABETES EYE EXAM

## 2011-10-03 ENCOUNTER — Encounter: Payer: Self-pay | Admitting: Family Medicine

## 2012-01-23 ENCOUNTER — Telehealth: Payer: Self-pay | Admitting: Family Medicine

## 2012-01-23 DIAGNOSIS — E119 Type 2 diabetes mellitus without complications: Secondary | ICD-10-CM

## 2012-01-23 DIAGNOSIS — E785 Hyperlipidemia, unspecified: Secondary | ICD-10-CM

## 2012-01-23 NOTE — Telephone Encounter (Signed)
Pls advise.  

## 2012-01-23 NOTE — Telephone Encounter (Signed)
Get lipid, hepatic, and A1C

## 2012-01-23 NOTE — Telephone Encounter (Signed)
Pt is scheduled for 6 month follow-up on 10/25

## 2012-01-23 NOTE — Telephone Encounter (Signed)
Patient's spouse called stating that her husband normally has blood work before seeing the Md and she would like to know if there is even a point of coming to see the MD if he does not have blood work. Please advise.

## 2012-01-23 NOTE — Telephone Encounter (Signed)
Pt wife informed, labs ordered

## 2012-02-16 ENCOUNTER — Other Ambulatory Visit (INDEPENDENT_AMBULATORY_CARE_PROVIDER_SITE_OTHER): Payer: Self-pay

## 2012-02-16 DIAGNOSIS — E785 Hyperlipidemia, unspecified: Secondary | ICD-10-CM

## 2012-02-16 LAB — HEPATIC FUNCTION PANEL
Alkaline Phosphatase: 81 U/L (ref 39–117)
Bilirubin, Direct: 0.2 mg/dL (ref 0.0–0.3)

## 2012-02-17 NOTE — Progress Notes (Signed)
Quick Note:  Pt informed on personally identified VM ______ 

## 2012-02-20 ENCOUNTER — Encounter: Payer: Self-pay | Admitting: Family Medicine

## 2012-02-20 ENCOUNTER — Ambulatory Visit (INDEPENDENT_AMBULATORY_CARE_PROVIDER_SITE_OTHER): Payer: Self-pay | Admitting: Family Medicine

## 2012-02-20 VITALS — BP 138/74 | Temp 97.8°F | Wt 174.0 lb

## 2012-02-20 DIAGNOSIS — IMO0001 Reserved for inherently not codable concepts without codable children: Secondary | ICD-10-CM

## 2012-02-20 DIAGNOSIS — E119 Type 2 diabetes mellitus without complications: Secondary | ICD-10-CM

## 2012-02-20 DIAGNOSIS — E785 Hyperlipidemia, unspecified: Secondary | ICD-10-CM

## 2012-02-20 DIAGNOSIS — R030 Elevated blood-pressure reading, without diagnosis of hypertension: Secondary | ICD-10-CM

## 2012-02-20 LAB — HEPATIC FUNCTION PANEL
Bilirubin, Direct: 0.1 mg/dL (ref 0.0–0.3)
Total Bilirubin: 1 mg/dL (ref 0.3–1.2)
Total Protein: 7.3 g/dL (ref 6.0–8.3)

## 2012-02-20 LAB — LIPID PANEL
HDL: 32.1 mg/dL — ABNORMAL LOW (ref >39.00)
VLDL: 61.8 mg/dL — ABNORMAL HIGH (ref 0.0–40.0)

## 2012-02-20 LAB — HEMOGLOBIN A1C: Hgb A1c MFr Bld: 8.2 % — ABNORMAL HIGH (ref 4.6–6.5)

## 2012-02-20 LAB — BASIC METABOLIC PANEL
BUN: 19 mg/dL (ref 6–23)
Chloride: 102 mEq/L (ref 96–112)
Glucose, Bld: 195 mg/dL — ABNORMAL HIGH (ref 70–99)
Potassium: 5 mEq/L (ref 3.5–5.1)

## 2012-02-20 NOTE — Progress Notes (Signed)
  Subjective:    Patient ID: Adrian Wood, male    DOB: June 20, 1926, 78 y.o.   MRN: [id number]  HPI  Patient seen for medical followup. He has type 2 diabetes and hyperlipidemia. He continues to have some lower extremity muscle aches off and on. Not clearly related pravastatin as these did not improve off pravastatin. Blood sugars relatively stable. Somewhat up and down. Last A1c 7.7%. No hypoglycemia. Takes pravastatin for hyperlipidemia.  No recent chest pains. Stays quite active physically. Already had flu vaccine.  Past Medical History  Diagnosis Date  . DIAB W/O COMP TYPE II/UNS NOT STATED UNCNTRL 11/09/2008  . DEPRESSION 11/09/2008  . Microscopic hematuria 11/16/2009  . Nonspecific (abnormal) findings on radiological and other examination of body structure 12/10/2009  . ABNORMAL CHEST XRAY 12/10/2009  . HYPERLIPIDEMIA 05/24/2010   No past surgical history on file.  reports that he quit smoking about 29 years ago. His smoking use included Cigarettes. He has a 30 pack-year smoking history. He does not have any smokeless tobacco history on file. His alcohol and drug histories not on file. family history is not on file. No Known Allergies    Review of Systems  Constitutional: Negative for fatigue.  Eyes: Negative for visual disturbance.  Respiratory: Negative for cough, chest tightness and shortness of breath.   Cardiovascular: Negative for chest pain, palpitations and leg swelling.  Genitourinary: Negative for dysuria.  Skin: Negative for rash.  Neurological: Negative for dizziness, syncope, weakness, light-headedness and headaches.       Objective:   Physical Exam  Constitutional: He appears well-developed and well-nourished.  Neck: Neck supple. No thyromegaly present.  Cardiovascular: Normal rate and regular rhythm.   Pulmonary/Chest: Effort normal and breath sounds normal. No respiratory distress. He has no wheezes. He has no rales.  Musculoskeletal: He exhibits no edema.     Feet reveal no skin lesions. Good distal foot pulses. Good capillary refill. No calluses. Normal sensation with monofilament testing           Assessment & Plan:  #1 type 2 diabetes. Recheck A1c. History of marginal control.  #2 hyperlipidemia. Recheck lipid and hepatic panel  #3 borderline elevated blood pressure. Check urine microalbumin. If positive consider ACE inhibitor

## 2012-02-23 ENCOUNTER — Other Ambulatory Visit: Payer: Self-pay | Admitting: *Deleted

## 2012-02-23 DIAGNOSIS — E119 Type 2 diabetes mellitus without complications: Secondary | ICD-10-CM

## 2012-02-23 MED ORDER — METFORMIN HCL 500 MG PO TABS: 500.0000 mg | ORAL_TABLET | Freq: Every day | ORAL | Status: DC

## 2012-02-23 NOTE — Progress Notes (Signed)
Quick Note:  Pt informed on home personally identified VM ______ 

## 2012-05-21 ENCOUNTER — Other Ambulatory Visit: Payer: Self-pay | Admitting: *Deleted

## 2012-05-21 ENCOUNTER — Other Ambulatory Visit (INDEPENDENT_AMBULATORY_CARE_PROVIDER_SITE_OTHER): Payer: Self-pay

## 2012-05-21 DIAGNOSIS — E119 Type 2 diabetes mellitus without complications: Secondary | ICD-10-CM

## 2012-05-21 MED ORDER — METFORMIN HCL 500 MG PO TABS: 500.0000 mg | ORAL_TABLET | Freq: Two times a day (BID) | ORAL | Status: DC

## 2012-05-21 NOTE — Progress Notes (Signed)
Quick Note:  Pt informed ______ 

## 2012-06-05 ENCOUNTER — Encounter (HOSPITAL_COMMUNITY): Payer: Self-pay | Admitting: Emergency Medicine

## 2012-06-05 ENCOUNTER — Emergency Department (HOSPITAL_COMMUNITY): Payer: Self-pay

## 2012-06-05 ENCOUNTER — Emergency Department (HOSPITAL_COMMUNITY)
Admission: EM | Admit: 2012-06-05 | Discharge: 2012-06-05 | Disposition: A | Discharge status: Home / Self Care | Payer: Self-pay | Attending: Emergency Medicine | Admitting: Emergency Medicine

## 2012-06-05 DIAGNOSIS — E119 Type 2 diabetes mellitus without complications: Secondary | ICD-10-CM | POA: Insufficient documentation

## 2012-06-05 DIAGNOSIS — Z7982 Long term (current) use of aspirin: Secondary | ICD-10-CM | POA: Insufficient documentation

## 2012-06-05 DIAGNOSIS — Z87891 Personal history of nicotine dependence: Secondary | ICD-10-CM | POA: Insufficient documentation

## 2012-06-05 DIAGNOSIS — Z8639 Personal history of other endocrine, nutritional and metabolic disease: Secondary | ICD-10-CM | POA: Insufficient documentation

## 2012-06-05 DIAGNOSIS — W010XXA Fall on same level from slipping, tripping and stumbling without subsequent striking against object, initial encounter: Secondary | ICD-10-CM | POA: Insufficient documentation

## 2012-06-05 DIAGNOSIS — S52023A Displaced fracture of olecranon process without intraarticular extension of unspecified ulna, initial encounter for closed fracture: Secondary | ICD-10-CM | POA: Insufficient documentation

## 2012-06-05 DIAGNOSIS — Y92009 Unspecified place in unspecified non-institutional (private) residence as the place of occurrence of the external cause: Secondary | ICD-10-CM | POA: Insufficient documentation

## 2012-06-05 DIAGNOSIS — Z8659 Personal history of other mental and behavioral disorders: Secondary | ICD-10-CM | POA: Insufficient documentation

## 2012-06-05 DIAGNOSIS — Z79899 Other long term (current) drug therapy: Secondary | ICD-10-CM | POA: Insufficient documentation

## 2012-06-05 DIAGNOSIS — Y9301 Activity, walking, marching and hiking: Secondary | ICD-10-CM | POA: Insufficient documentation

## 2012-06-05 DIAGNOSIS — Z87448 Personal history of other diseases of urinary system: Secondary | ICD-10-CM | POA: Insufficient documentation

## 2012-06-05 DIAGNOSIS — Z862 Personal history of diseases of the blood and blood-forming organs and certain disorders involving the immune mechanism: Secondary | ICD-10-CM | POA: Insufficient documentation

## 2012-06-05 MED ORDER — HYDROCODONE-ACETAMINOPHEN 5-325 MG PO TABS: 1.0000 | ORAL_TABLET | Freq: Four times a day (QID) | ORAL | Status: DC | PRN

## 2012-06-05 NOTE — ED Provider Notes (Signed)
History    This chart was scribed for non-physician practitioner working with Gerhard Munch, MD by Burman Nieves. This patient was seen in room WTR9/WTR9 and the patient's care was started at 6:11 PM. CSN: [id number]  Arrival date & time 06/05/12  1611      Chief Complaint  Patient presents with  . Elbow Pain    (Consider location/radiation/quality/duration/timing/severity/associated sxs/prior treatment) Patient is a 79 y.o. male presenting with fall. The history is provided by the patient. No language interpreter was used.  Fall The accident occurred 3 to 5 hours ago. The fall occurred while walking. He fell from a height of 1 to 2 ft. He landed on grass. The point of impact was the right elbow. The pain is present in the right elbow. The pain is moderate. He was ambulatory at the scene. There was no entrapment after the fall. There was no drug use involved in the accident. There was no alcohol use involved in the accident. Pertinent negatives include no fever, no nausea and no vomiting. The symptoms are aggravated by activity. He has tried nothing for the symptoms.    Raine Elsass is a 79 y.o. male who presents to the Emergency Department complaining of moderate constant  right elbow pain. Pt was out in his garden  when he slipped and fell on his right elbow earlier today. Pt denies LOC, head injury, fever, chills, cough, nausea, vomiting, diarrhea, SOB, weakness, and any other associated symptoms. He denies any pain medication taken. Other than DM, Pt denies any other medical history.  Past Medical History  Diagnosis Date  . DIAB W/O COMP TYPE II/UNS NOT STATED UNCNTRL 11/09/2008  . DEPRESSION 11/09/2008  . Microscopic hematuria 11/16/2009  . Nonspecific (abnormal) findings on radiological and other examination of body structure 12/10/2009  . ABNORMAL CHEST XRAY 12/10/2009  . HYPERLIPIDEMIA 05/24/2010    No past surgical history on file.  No family history on file.  History   Substance Use Topics  . Smoking status: Former Smoker -- 1.00 packs/day for 30 years    Types: Cigarettes    Quit date: 08/21/1982  . Smokeless tobacco: Never Used  . Alcohol Use: 12.6 oz/week    21 Cans of beer per week      Review of Systems  Constitutional: Negative for fever and chills.  Respiratory: Negative for shortness of breath.   Gastrointestinal: Negative for nausea and vomiting.  Musculoskeletal:       Right elbow pain  Neurological: Negative for syncope and weakness.  All other systems reviewed and are negative.    Allergies  Review of patient's allergies indicates no known allergies.  Home Medications   Current Outpatient Rx  Name  Route  Sig  Dispense  Refill  . aspirin 81 MG tablet   Oral   Take 81 mg by mouth daily.           . cimetidine (TAGAMET) 200 MG tablet   Oral   Take 200 mg by mouth daily.           Marland Kitchen Cod Liver Oil 10 MINIM CAPS   Oral   Take by mouth daily.           Marland Kitchen glimepiride (AMARYL) 4 MG tablet   Oral   Take 1 tablet (4 mg total) by mouth daily before breakfast.   90 tablet   3   . metFORMIN (GLUCOPHAGE) 500 MG tablet   Oral   Take 1 tablet (500 mg total) by mouth  2 (two) times daily with a meal.   180 tablet   3   . Multiple Vitamins-Minerals (MULTIVITAMIN WITH MINERALS) tablet   Oral   Take 1 tablet by mouth daily.           . polyethylene glycol powder (GLYCOLAX/MIRALAX) powder   Oral   Take 17 g by mouth daily. Use as directed            BP 151/81  Pulse 88  Temp(Src) 98.6 F (37 C) (Oral)  Resp 16  SpO2 96%  Physical Exam  Nursing note and vitals reviewed. Constitutional: He is oriented to person, place, and time. He appears well-developed and well-nourished. No distress.  HENT:  Head: Normocephalic and atraumatic.  Eyes: EOM are normal.  Neck: Neck supple. No tracheal deviation present.  Cardiovascular: Normal rate.   Pulses:      Radial pulses are 2+ on the right side, and 2+ on the  left side.  Good radial pulse  Pulmonary/Chest: Effort normal. No respiratory distress.  Musculoskeletal: Normal range of motion. He exhibits tenderness.       Right shoulder: He exhibits normal range of motion, no tenderness and no swelling.       Right wrist: He exhibits normal range of motion, no tenderness and no swelling.  Distal part of humerus is tender to palpation. Swelling and bruising over the right  olecrannon process Patient with full extension of right elbow.  Patient flexion of the right elbow somewhat limited.  Approximately 120 degrees of flexion.   ROM nml for upper right shoulder ROM nml for right wrist   Neurological: He is alert and oriented to person, place, and time. No sensory deficit. Gait normal.  Skin: Skin is warm and dry.  Psychiatric: He has a normal mood and affect. His behavior is normal.    ED Course  Procedures (including critical care time)  DIAGNOSTIC STUDIES: Oxygen Saturation is 96% on room air, adequate by my interpretation.    COORDINATION OF CARE: 6:23 PM Discussed ED treatment with pt and pt agrees. Pt was offered pain medication and refused as of now.    Labs Reviewed - No data to display Dg Elbow Complete Right  06/05/2012  *RADIOLOGY REPORT*  Clinical Data: Fall.  Pain  RIGHT ELBOW - COMPLETE 3+ VIEW  Comparison: None.  Findings: Extensive soft tissue swelling posterior to the elbow. There is an avulsion fracture of the olecranon with displacement.  The elbow joint is normal.  No fracture of the radial head.  There is degenerative spurring of the medial and lateral   collateral ligaments.  IMPRESSION: Acute avulsion fracture of the posterior olecranon with displacement and overlying soft tissue swelling.   Original Report Authenticated By: Janeece Riggers, M.D.      No diagnosis found.  Patient and xray results discussed with Dr. Jeraldine Loots.  MDM  Patient with a closed avulsion fracture of the olecranon.  Patient neurovascularly intact.  Arm  put in a sling.  Patient instructed to follow up with Orthopedics on Monday.    I personally performed the services described in this documentation, which was scribed in my presence. The recorded information has been reviewed and is accurate.      Marland Kitchen  Pascal Lux [place], PA-C 06/06/12 1200

## 2012-06-05 NOTE — ED Notes (Signed)
Ortho tech paged for application of sling and sling immobilizer.

## 2012-06-05 NOTE — ED Notes (Signed)
Pt out gathering wood, slipped and fell, landing on right elbow. Went to Urgent Care, who sent him here, stating "you have chipped a bone in your elbow". Elbow is swollen bruised, pulses present ulnar and radial, limited ROM.

## 2012-06-05 NOTE — ED Notes (Signed)
Ortho tech at bedside 

## 2012-06-06 NOTE — ED Provider Notes (Signed)
  Medical screening examination/treatment/procedure(s) were performed by non-physician practitioner and as supervising physician I was immediately available for consultation/collaboration.    Arnetha Silverthorne, MD 06/06/12 2120 

## 2012-08-03 ENCOUNTER — Other Ambulatory Visit: Payer: Self-pay | Admitting: *Deleted

## 2012-08-03 MED ORDER — PRAVASTATIN SODIUM 20 MG PO TABS: ORAL_TABLET | ORAL | Status: DC

## 2012-08-20 ENCOUNTER — Encounter: Payer: Self-pay | Admitting: Family Medicine

## 2012-08-20 ENCOUNTER — Ambulatory Visit (INDEPENDENT_AMBULATORY_CARE_PROVIDER_SITE_OTHER): Payer: Self-pay | Admitting: Family Medicine

## 2012-08-20 VITALS — BP 150/88 | Temp 97.7°F | Wt 168.0 lb

## 2012-08-20 DIAGNOSIS — E119 Type 2 diabetes mellitus without complications: Secondary | ICD-10-CM

## 2012-08-20 LAB — MICROALBUMIN / CREATININE URINE RATIO
Creatinine,U: 202.2 mg/dL
Microalb Creat Ratio: 1.2 mg/g (ref 0.0–30.0)

## 2012-08-20 LAB — HEMOGLOBIN A1C: Hgb A1c MFr Bld: 6.7 % — ABNORMAL HIGH (ref 4.6–6.5)

## 2012-08-20 NOTE — Progress Notes (Signed)
Quick Note:  Pt informed ______ 

## 2012-08-20 NOTE — Progress Notes (Signed)
  Subjective:    Patient ID: Adrian Wood, male    DOB: Apr 01, 1927, 79 y.o.   MRN: [id number]  HPI Patient seen for followup type 2 diabetes. He takes Amaryl and metformin. Recent A1c 8.5%. He has been reluctant to take insulin in the past. Blood sugars vary considerably. He had recent fall with olecranon avulsion fracture and olecranon bursitis and this is resolved. Last eye exam was approximately one year ago. He does not have any complications of diabetes thus far  Past Medical History  Diagnosis Date  . DIAB W/O COMP TYPE II/UNS NOT STATED UNCNTRL 11/09/2008  . DEPRESSION 11/09/2008  . Microscopic hematuria 11/16/2009  . Nonspecific (abnormal) findings on radiological and other examination of body structure 12/10/2009  . ABNORMAL CHEST XRAY 12/10/2009  . HYPERLIPIDEMIA 05/24/2010   No past surgical history on file.  reports that he quit smoking about 30 years ago. His smoking use included Cigarettes. He has a 30 pack-year smoking history. He has never used smokeless tobacco. He reports that he drinks about 12.6 ounces of alcohol per week. He reports that he does not use illicit drugs. family history is not on file. No Known Allergies    Review of Systems  Constitutional: Negative for chills, fatigue and unexpected weight change.  Respiratory: Negative for cough and wheezing.   Cardiovascular: Negative for chest pain, palpitations and leg swelling.  Skin:               Objective:   Physical Exam  Constitutional: He appears well-developed and well-nourished. No distress.  Neck: Neck supple.  Cardiovascular: Normal rate and regular rhythm.   Pulmonary/Chest: Effort normal and breath sounds normal. No respiratory distress. He has no wheezes. He has no rales.  Musculoskeletal: He exhibits no edema.  Skin:  Feet reveal no skin lesions. Good distal foot pulses. Good capillary refill. No calluses. Normal sensation with monofilament testing           Assessment & Plan:   Type 2 diabetes. History of poor control. Recheck A1c and urine microalbumin screen. If still elevated consider either further titration of metformin versus once daily insulin versus additional medication orally

## 2012-08-23 ENCOUNTER — Ambulatory Visit: Payer: Self-pay | Admitting: Family Medicine

## 2013-02-18 ENCOUNTER — Ambulatory Visit: Payer: Self-pay | Admitting: Family Medicine

## 2013-02-28 ENCOUNTER — Ambulatory Visit (INDEPENDENT_AMBULATORY_CARE_PROVIDER_SITE_OTHER): Payer: Medicare Other | Admitting: Family Medicine

## 2013-02-28 ENCOUNTER — Encounter: Payer: Self-pay | Admitting: Family Medicine

## 2013-02-28 VITALS — BP 132/72 | HR 72 | Temp 97.9°F | Wt 171.0 lb

## 2013-02-28 DIAGNOSIS — E785 Hyperlipidemia, unspecified: Secondary | ICD-10-CM

## 2013-02-28 DIAGNOSIS — E119 Type 2 diabetes mellitus without complications: Secondary | ICD-10-CM

## 2013-02-28 LAB — HEPATIC FUNCTION PANEL
AST: 19 U/L (ref 0–37)
Alkaline Phosphatase: 79 U/L (ref 39–117)
Bilirubin, Direct: 0.1 mg/dL (ref 0.0–0.3)

## 2013-02-28 LAB — BASIC METABOLIC PANEL
BUN: 16 mg/dL (ref 6–23)
Creatinine, Ser: 1.2 mg/dL (ref 0.4–1.5)
GFR: 58.96 mL/min — ABNORMAL LOW (ref >60.00)
Glucose, Bld: 152 mg/dL — ABNORMAL HIGH (ref 70–99)
Potassium: 5 mEq/L (ref 3.5–5.1)

## 2013-02-28 LAB — LIPID PANEL
Cholesterol: 176 mg/dL (ref 0–200)
Total CHOL/HDL Ratio: 4
VLDL: 64.4 mg/dL — ABNORMAL HIGH (ref 0.0–40.0)

## 2013-02-28 LAB — HM DIABETES FOOT EXAM: HM Diabetic Foot Exam: NORMAL

## 2013-02-28 LAB — LDL CHOLESTEROL, DIRECT: Direct LDL: 89.9 mg/dL

## 2013-02-28 NOTE — Progress Notes (Signed)
  Subjective:    Patient ID: Adrian Wood, male    DOB: June 05, 1926, 79 y.o.   MRN: [id number]  HPI Medical follow up Type 2 diabetes. Last A1c 6.7%. Blood sugars he states are " up and down".  Denies any hypoglycemia. Compliant with medications. No symptoms of hyperglycemia. He states last eye exam was last February and no retinopathy changes.  Patient denies any neuropathy symptoms. He does have some intermittent arthralgias for which he takes Aleve. Hyperlipidemia treated with pravastatin. He is overdue for lipids. No recent chest pains. No dizziness. No recent falls.  Past Medical History  Diagnosis Date  . DIAB W/O COMP TYPE II/UNS NOT STATED UNCNTRL 11/09/2008  . DEPRESSION 11/09/2008  . Microscopic hematuria 11/16/2009  . Nonspecific (abnormal) findings on radiological and other examination of body structure 12/10/2009  . ABNORMAL CHEST XRAY 12/10/2009  . HYPERLIPIDEMIA 05/24/2010   No past surgical history on file.  reports that he quit smoking about 30 years ago. His smoking use included Cigarettes. He has a 30 pack-year smoking history. He has never used smokeless tobacco. He reports that he drinks about 12.6 ounces of alcohol per week. He reports that he does not use illicit drugs. family history is not on file. No Known Allergies    Review of Systems  Constitutional: Negative for fatigue.  Eyes: Negative for visual disturbance.  Respiratory: Negative for cough, chest tightness and shortness of breath.   Cardiovascular: Negative for chest pain, palpitations and leg swelling.  Neurological: Negative for dizziness, syncope, weakness, light-headedness and headaches.       Objective:   Physical Exam  Constitutional: He is oriented to person, place, and time. He appears well-developed and well-nourished.  Neck: Neck supple. No thyromegaly present.  Cardiovascular: Normal rate and regular rhythm.   Pulmonary/Chest: Effort normal and breath sounds normal. No respiratory  distress. He has no wheezes. He has no rales.  Musculoskeletal: He exhibits no edema.  Neurological: He is alert and oriented to person, place, and time.  Skin:  Feet reveal no skin lesions. Good distal foot pulses. Good capillary refill. No calluses. Normal sensation with monofilament testing           Assessment & Plan:  #1 type 2 diabetes. History fair control. Recheck A1c. Continue yearly I exam #2 history of hyperlipidemia. Check lipid and hepatic panel. #3 health maintenance. Flu vaccine already given

## 2013-03-17 ENCOUNTER — Other Ambulatory Visit: Payer: Self-pay | Admitting: Family Medicine

## 2013-04-19 ENCOUNTER — Other Ambulatory Visit: Payer: Self-pay | Admitting: Family Medicine

## 2013-07-07 ENCOUNTER — Telehealth: Payer: Self-pay | Admitting: Family Medicine

## 2013-07-07 NOTE — Telephone Encounter (Signed)
Hemoglobin A1c

## 2013-07-07 NOTE — Telephone Encounter (Signed)
Pt has 6 mo fup on 5/8 and would like labs prior to appt. Is it ok to schedule?

## 2013-07-07 NOTE — Telephone Encounter (Signed)
What labs would you like the patient to get. I will put the orders in

## 2013-07-08 ENCOUNTER — Other Ambulatory Visit: Payer: Self-pay | Admitting: Family Medicine

## 2013-07-08 DIAGNOSIS — E119 Type 2 diabetes mellitus without complications: Secondary | ICD-10-CM

## 2013-07-08 NOTE — Telephone Encounter (Signed)
Lab is ordered.  

## 2013-07-12 ENCOUNTER — Other Ambulatory Visit: Payer: Self-pay | Admitting: Family Medicine

## 2013-07-14 LAB — HM DIABETES EYE EXAM

## 2013-08-03 LAB — HM DIABETES EYE EXAM

## 2013-08-22 ENCOUNTER — Other Ambulatory Visit (INDEPENDENT_AMBULATORY_CARE_PROVIDER_SITE_OTHER): Payer: Medicare Other

## 2013-08-22 DIAGNOSIS — E119 Type 2 diabetes mellitus without complications: Secondary | ICD-10-CM

## 2013-08-22 LAB — HEMOGLOBIN A1C: Hgb A1c MFr Bld: 7.1 % — ABNORMAL HIGH (ref 4.6–6.5)

## 2013-08-24 ENCOUNTER — Other Ambulatory Visit: Payer: Medicare Other

## 2013-09-02 ENCOUNTER — Ambulatory Visit (INDEPENDENT_AMBULATORY_CARE_PROVIDER_SITE_OTHER): Payer: Medicare Other | Admitting: Family Medicine

## 2013-09-02 ENCOUNTER — Encounter: Payer: Self-pay | Admitting: Family Medicine

## 2013-09-02 VITALS — BP 136/70 | HR 72 | Temp 97.9°F | Wt 172.0 lb

## 2013-09-02 DIAGNOSIS — E785 Hyperlipidemia, unspecified: Secondary | ICD-10-CM

## 2013-09-02 DIAGNOSIS — E119 Type 2 diabetes mellitus without complications: Secondary | ICD-10-CM

## 2013-09-02 LAB — HM DIABETES FOOT EXAM: HM Diabetic Foot Exam: NORMAL

## 2013-09-02 NOTE — Progress Notes (Signed)
   Subjective:    Patient ID: Adrian Wood, male    DOB: [birth year], 80 y.o.   MRN: [id number]  HPI Patient seen for medical followup. History of type 2 diabetes, hyperlipidemia, and osteoarthritis. He has seen eye doctor recently and has bilateral cataracts but has not scheduled surgery because of cost issues. No history of retinopathy. He has occasional tingling in his feet but no major impairment. Recent A1c 7.1%. His blood sugars are up and down. He remains on metformin and Amaryl. No recent hypoglycemia. Intermittent pravastatin for hyperlipidemia. He stays quite active. Low risk for falls.  Past Medical History  Diagnosis Date  . DIAB W/O COMP TYPE II/UNS NOT STATED UNCNTRL 11/09/2008  . DEPRESSION 11/09/2008  . Microscopic hematuria 11/16/2009  . Nonspecific (abnormal) findings on radiological and other examination of body structure 12/10/2009  . ABNORMAL CHEST XRAY 12/10/2009  . HYPERLIPIDEMIA 05/24/2010   No past surgical history on file.  reports that he quit smoking about 31 years ago. His smoking use included Cigarettes. He has a 30 pack-year smoking history. He has never used smokeless tobacco. He reports that he drinks about 12.6 ounces of alcohol per week. He reports that he does not use illicit drugs. family history is not on file. No Known Allergies    Review of Systems  Constitutional: Negative for fatigue.  Eyes: Negative for visual disturbance.  Respiratory: Negative for cough, chest tightness and shortness of breath.   Cardiovascular: Negative for chest pain, palpitations and leg swelling.  Endocrine: Negative for polydipsia and polyuria.  Neurological: Negative for dizziness, syncope, weakness, light-headedness and headaches.       Objective:   Physical Exam  Constitutional: He appears well-developed and well-nourished.  Neck: Neck supple.  Cardiovascular: Normal rate and regular rhythm.   Pulmonary/Chest: Effort normal and breath sounds normal. No respiratory  distress. He has no wheezes. He has no rales.  Musculoskeletal: He exhibits no edema.  Skin:  Feet reveal no skin lesions. Good distal foot pulses. Good capillary refill. No calluses. Normal sensation with monofilament testing           Assessment & Plan:  #1 type 2 diabetes. History of good control. Continue current medications. Recheck A1c in 6 months urine microalbumin then. #2 hyperlipidemia. Check lipids at followup in 6 months. Continue pravastatin

## 2013-09-02 NOTE — Progress Notes (Signed)
Pre visit review using our clinic review tool, if applicable. No additional management support is needed unless otherwise documented below in the visit note. 

## 2013-09-02 NOTE — Patient Instructions (Signed)
Claritin, Allegra, or Zyrtec can be taken for congestion. Do not take the kind with "D." Can also use Nasocort or Flonase nasal spray.

## 2013-09-21 ENCOUNTER — Telehealth: Payer: Self-pay

## 2013-09-21 NOTE — Telephone Encounter (Signed)
Relevant patient education assigned to patient using Emmi. ° °

## 2013-10-06 ENCOUNTER — Other Ambulatory Visit: Payer: Self-pay | Admitting: Family Medicine

## 2014-01-06 ENCOUNTER — Telehealth: Payer: Self-pay | Admitting: Family Medicine

## 2014-01-06 MED ORDER — PRAVASTATIN SODIUM 20 MG PO TABS: ORAL_TABLET | ORAL | Status: DC

## 2014-01-06 NOTE — Telephone Encounter (Signed)
[facility] is requesting re-fill on pravastatin (PRAVACHOL) 20 MG tablet

## 2014-01-06 NOTE — Telephone Encounter (Signed)
Rx sent to pharmacy   

## 2014-03-03 ENCOUNTER — Other Ambulatory Visit: Payer: Medicare Other

## 2014-03-08 ENCOUNTER — Telehealth: Payer: Self-pay | Admitting: Family Medicine

## 2014-03-08 MED ORDER — GLIMEPIRIDE 4 MG PO TABS: ORAL_TABLET | ORAL | Status: DC

## 2014-03-08 MED ORDER — METFORMIN HCL 500 MG PO TABS: ORAL_TABLET | ORAL | Status: DC

## 2014-03-08 NOTE — Telephone Encounter (Signed)
Pt request refill glimepiride (AMARYL) 4 MG tablet and  metFORMIN (GLUCOPHAGE) 500 MG tablet [facility]  Pt had to push his appt to dec due to wife having rotator cuff surgery and she is the only driver. Pt did not realize out of meds and needs asap is possible

## 2014-03-08 NOTE — Telephone Encounter (Signed)
Rx sent to pharmacy   

## 2014-03-10 ENCOUNTER — Ambulatory Visit: Payer: Medicare Other | Admitting: Family Medicine

## 2014-04-07 ENCOUNTER — Other Ambulatory Visit: Payer: Self-pay | Admitting: Family Medicine

## 2014-04-13 ENCOUNTER — Other Ambulatory Visit (INDEPENDENT_AMBULATORY_CARE_PROVIDER_SITE_OTHER): Payer: Medicare HMO

## 2014-04-13 DIAGNOSIS — E785 Hyperlipidemia, unspecified: Secondary | ICD-10-CM

## 2014-04-13 DIAGNOSIS — E119 Type 2 diabetes mellitus without complications: Secondary | ICD-10-CM

## 2014-04-13 LAB — BASIC METABOLIC PANEL
BUN: 18 mg/dL (ref 6–23)
CHLORIDE: 104 meq/L (ref 96–112)
CO2: 24 mEq/L (ref 19–32)
CREATININE: 1.1 mg/dL (ref 0.4–1.5)
Calcium: 9.6 mg/dL (ref 8.4–10.5)
GFR: 68.23 mL/min (ref >60.00)
GLUCOSE: 149 mg/dL — AB (ref 70–99)
POTASSIUM: 4.8 meq/L (ref 3.5–5.1)
Sodium: 138 mEq/L (ref 135–145)

## 2014-04-13 LAB — HEPATIC FUNCTION PANEL
ALBUMIN: 4.2 g/dL (ref 3.5–5.2)
ALT: 21 U/L (ref 0–53)
AST: 20 U/L (ref 0–37)
Alkaline Phosphatase: 80 U/L (ref 39–117)
Bilirubin, Direct: 0 mg/dL (ref 0.0–0.3)
TOTAL PROTEIN: 7.2 g/dL (ref 6.0–8.3)
Total Bilirubin: 0.8 mg/dL (ref 0.2–1.2)

## 2014-04-13 LAB — HEMOGLOBIN A1C: HEMOGLOBIN A1C: 8 % — AB (ref 4.6–6.5)

## 2014-04-13 LAB — MICROALBUMIN / CREATININE URINE RATIO
CREATININE, U: 73.7 mg/dL
Microalb Creat Ratio: 1.5 mg/g (ref 0.0–30.0)
Microalb, Ur: 1.1 mg/dL (ref 0.0–1.9)

## 2014-04-13 LAB — LIPID PANEL
CHOLESTEROL: 146 mg/dL (ref 0–200)
HDL: 33.9 mg/dL — ABNORMAL LOW (ref >39.00)
NonHDL: 112.1
TRIGLYCERIDES: 233 mg/dL — AB (ref 0.0–149.0)
Total CHOL/HDL Ratio: 4
VLDL: 46.6 mg/dL — ABNORMAL HIGH (ref 0.0–40.0)

## 2014-04-13 LAB — LDL CHOLESTEROL, DIRECT: Direct LDL: 77.9 mg/dL

## 2014-04-19 ENCOUNTER — Ambulatory Visit (INDEPENDENT_AMBULATORY_CARE_PROVIDER_SITE_OTHER): Payer: Medicare HMO | Admitting: Family Medicine

## 2014-04-19 ENCOUNTER — Encounter: Payer: Self-pay | Admitting: Family Medicine

## 2014-04-19 VITALS — BP 132/70 | HR 72 | Temp 97.7°F | Wt 170.0 lb

## 2014-04-19 DIAGNOSIS — IMO0002 Reserved for concepts with insufficient information to code with codable children: Secondary | ICD-10-CM

## 2014-04-19 DIAGNOSIS — R208 Other disturbances of skin sensation: Secondary | ICD-10-CM

## 2014-04-19 DIAGNOSIS — Z23 Encounter for immunization: Secondary | ICD-10-CM

## 2014-04-19 DIAGNOSIS — E785 Hyperlipidemia, unspecified: Secondary | ICD-10-CM

## 2014-04-19 DIAGNOSIS — E1165 Type 2 diabetes mellitus with hyperglycemia: Secondary | ICD-10-CM

## 2014-04-19 MED ORDER — METFORMIN HCL 500 MG PO TABS: ORAL_TABLET | ORAL | Status: DC

## 2014-04-19 NOTE — Progress Notes (Signed)
Pre visit review using our clinic review tool, if applicable. No additional management support is needed unless otherwise documented below in the visit note. 

## 2014-04-19 NOTE — Addendum Note (Signed)
Addended by: Marcina Millard on: 04/19/2014 09:31 AM   Modules accepted: Orders

## 2014-04-19 NOTE — Addendum Note (Signed)
Addended by: Eulas Post on: 04/19/2014 08:58 AM   Modules accepted: Orders

## 2014-04-19 NOTE — Progress Notes (Signed)
   Subjective:    Patient ID: Adrian Wood, male    DOB: Nov 30, 1926, 80 y.o.   MRN: [id number]  HPI  patient seen for medical follow-up.  Type 2 diabetes. Generally well-controlled but most recent A1c 8.0%. This is up from 7.1% previously. He takes, parotid metformin. Blood sugars also been elevated by his readings. Compliant with diet. He has been under some stress recently with trying to purchase a new home. He's had flu vaccine already. Denies hypoglycemia.  Dyslipidemia treated with pravastatin. No history of CAD. No recent chest pains. No dizziness.  He complains of some intermittent dysesthesias occasional numbness right foot. No back pain. No weakness. No left foot symptoms. Denies claudication symptoms.  Recent labs reviewed. Elevated triglycerides and low HDL. LDL at goal. Renal function stable  Past Medical History  Diagnosis Date  . DIAB W/O COMP TYPE II/UNS NOT STATED UNCNTRL 11/09/2008  . DEPRESSION 11/09/2008  . Microscopic hematuria 11/16/2009  . Nonspecific (abnormal) findings on radiological and other examination of body structure 12/10/2009  . ABNORMAL CHEST XRAY 12/10/2009  . HYPERLIPIDEMIA 05/24/2010   No past surgical history on file.  reports that he quit smoking about 31 years ago. His smoking use included Cigarettes. He has a 30 pack-year smoking history. He has never used smokeless tobacco. He reports that he drinks about 12.6 oz of alcohol per week. He reports that he does not use illicit drugs. family history is not on file. No Known Allergies   Review of Systems  Constitutional: Negative for fatigue.  Eyes: Negative for visual disturbance.  Respiratory: Negative for cough, chest tightness and shortness of breath.   Cardiovascular: Negative for chest pain, palpitations and leg swelling.  Endocrine: Negative for polydipsia and polyuria.  Genitourinary: Negative for dysuria.  Neurological: Negative for dizziness, syncope, weakness, light-headedness and  headaches.       Objective:   Physical Exam  Constitutional: He appears well-developed and well-nourished.  Neck: Neck supple. No thyromegaly present.  Cardiovascular: Normal rate and regular rhythm.   Pulmonary/Chest: Effort normal and breath sounds normal. No respiratory distress. He has no wheezes. He has no rales.  Musculoskeletal: He exhibits no edema.  Skin:  No foot lesions. Feet are warm to touch. No calluses. No ulcerations. Normal sensory function to touch.          Assessment & Plan:  #1 type 2 diabetes. Worsening control. He has normal renal function. Titrate metformin 500 mg 2 twice daily. Reassess 4 months. May need to consider low-dose long-acting insulin at that point if not further improved. #2 dyslipidemia. Elevated triglycerides and low HDL with LDL goal. Continue pravastatin #3 dysesthesias right foot. No weakness. Question neuropathy related to diabetes. He does not describe any lumbar radiculopathy symptoms. Hopefully this will improve with improved diabetes control

## 2014-04-27 ENCOUNTER — Ambulatory Visit (INDEPENDENT_AMBULATORY_CARE_PROVIDER_SITE_OTHER): Payer: Medicare HMO | Admitting: Family Medicine

## 2014-04-27 VITALS — BP 130/80 | HR 78 | Temp 97.9°F | Wt 167.0 lb

## 2014-04-27 DIAGNOSIS — J209 Acute bronchitis, unspecified: Secondary | ICD-10-CM

## 2014-04-27 MED ORDER — AZITHROMYCIN 250 MG PO TABS: ORAL_TABLET | ORAL | Status: AC

## 2014-04-27 NOTE — Progress Notes (Signed)
Pre visit review using our clinic review tool, if applicable. No additional management support is needed unless otherwise documented below in the visit note. 

## 2014-04-27 NOTE — Patient Instructions (Signed)

## 2014-04-27 NOTE — Progress Notes (Signed)
   Subjective:    Patient ID: Adrian Wood, male    DOB: 10/28/1926, 80 y.o.   MRN: [id number]  HPI Acute visit. Patient is seen with cough is mostly dry for the past 3-4 days. No fever. He's had minimal nasal congestion. Mild fatigue. Not aware of any chills or headache. He has had mild sore throat. Wife with similar symptoms. Using over-the-counter cough medications.  Past Medical History  Diagnosis Date  . DIAB W/O COMP TYPE II/UNS NOT STATED UNCNTRL 11/09/2008  . DEPRESSION 11/09/2008  . Microscopic hematuria 11/16/2009  . Nonspecific (abnormal) findings on radiological and other examination of body structure 12/10/2009  . ABNORMAL CHEST XRAY 12/10/2009  . HYPERLIPIDEMIA 05/24/2010   No past surgical history on file.  reports that he quit smoking about 31 years ago. His smoking use included Cigarettes. He has a 30 pack-year smoking history. He has never used smokeless tobacco. He reports that he drinks about 12.6 oz of alcohol per week. He reports that he does not use illicit drugs. family history is not on file. No Known Allergies    Review of Systems  Constitutional: Positive for fatigue. Negative for chills.  HENT: Positive for sore throat.   Respiratory: Positive for cough.        Objective:   Physical Exam  Constitutional: He appears well-developed and well-nourished.  HENT:  Right Ear: External ear normal.  Left Ear: External ear normal.  Mouth/Throat: Oropharynx is clear and moist.  Neck: Neck supple.  Cardiovascular: Normal rate and regular rhythm.   Pulmonary/Chest: Effort normal and breath sounds normal. No respiratory distress. He has no wheezes. He has no rales.  Lymphadenopathy:    He has no cervical adenopathy.          Assessment & Plan:  Acute bronchitis. Suspect viral. We've recommended observation and treatment symptomatically at this point. Given his age start Zithromax if he develops any fever worsening symptoms

## 2014-07-03 LAB — HM DIABETES EYE EXAM

## 2014-07-20 ENCOUNTER — Encounter: Payer: Self-pay | Admitting: Family Medicine

## 2014-08-06 ENCOUNTER — Other Ambulatory Visit: Payer: Self-pay | Admitting: Family Medicine

## 2014-08-07 ENCOUNTER — Telehealth: Payer: Self-pay | Admitting: Family Medicine

## 2014-08-07 NOTE — Telephone Encounter (Signed)
Rx sent to pharmacy   

## 2014-08-07 NOTE — Telephone Encounter (Signed)
Pt request refill of the following: pravastatin (PRAVACHOL) 20 MG tablet  Wife said pt has no more pills   Phamacy:

## 2014-08-08 ENCOUNTER — Other Ambulatory Visit (INDEPENDENT_AMBULATORY_CARE_PROVIDER_SITE_OTHER): Payer: Medicare HMO

## 2014-08-08 DIAGNOSIS — E1165 Type 2 diabetes mellitus with hyperglycemia: Secondary | ICD-10-CM | POA: Diagnosis not present

## 2014-08-08 DIAGNOSIS — IMO0002 Reserved for concepts with insufficient information to code with codable children: Secondary | ICD-10-CM

## 2014-08-08 LAB — HEMOGLOBIN A1C: Hgb A1c MFr Bld: 6.9 % — ABNORMAL HIGH (ref 4.6–6.5)

## 2014-08-18 ENCOUNTER — Ambulatory Visit (INDEPENDENT_AMBULATORY_CARE_PROVIDER_SITE_OTHER): Payer: Medicare HMO | Admitting: Family Medicine

## 2014-08-18 ENCOUNTER — Encounter: Payer: Self-pay | Admitting: Family Medicine

## 2014-08-18 VITALS — BP 128/80 | HR 74 | Temp 98.4°F | Wt 166.0 lb

## 2014-08-18 DIAGNOSIS — E1165 Type 2 diabetes mellitus with hyperglycemia: Secondary | ICD-10-CM | POA: Diagnosis not present

## 2014-08-18 DIAGNOSIS — E785 Hyperlipidemia, unspecified: Secondary | ICD-10-CM | POA: Diagnosis not present

## 2014-08-18 DIAGNOSIS — IMO0002 Reserved for concepts with insufficient information to code with codable children: Secondary | ICD-10-CM

## 2014-08-18 NOTE — Progress Notes (Signed)
Pre visit review using our clinic review tool, if applicable. No additional management support is needed unless otherwise documented below in the visit note. 

## 2014-08-18 NOTE — Progress Notes (Signed)
   Subjective:    Patient ID: Adrian Wood, male    DOB: July 22, 1926, 81 y.o.   MRN: [id number]  HPI  Follow-up type 2 diabetes. Improved control by recent A1c of 6.9%. He takes metformin and glimepiride. No recent hypoglycemia. Is on pravastatin for hyperlipidemia. Blood sugars fasting mostly low 100s but occasionally higher. He has been physically less active during the past year. He had recent cataract surgery and is pleased with outcome.  Past Medical History  Diagnosis Date  . DIAB W/O COMP TYPE II/UNS NOT STATED UNCNTRL 11/09/2008  . DEPRESSION 11/09/2008  . Microscopic hematuria 11/16/2009  . Nonspecific (abnormal) findings on radiological and other examination of body structure 12/10/2009  . ABNORMAL CHEST XRAY 12/10/2009  . HYPERLIPIDEMIA 05/24/2010   No past surgical history on file.  reports that he quit smoking about 32 years ago. His smoking use included Cigarettes. He has a 30 pack-year smoking history. He has never used smokeless tobacco. He reports that he drinks about 12.6 oz of alcohol per week. He reports that he does not use illicit drugs. family history is not on file. No Known Allergies   Review of Systems  Constitutional: Negative for fatigue.  Eyes: Negative for visual disturbance.  Respiratory: Negative for cough, chest tightness and shortness of breath.   Cardiovascular: Negative for chest pain, palpitations and leg swelling.  Endocrine: Negative for polydipsia and polyuria.  Neurological: Negative for dizziness, syncope, weakness, light-headedness and headaches.       Objective:   Physical Exam  Constitutional: He appears well-developed and well-nourished.  Neck: Neck supple. No thyromegaly present.  Cardiovascular: Normal rate and regular rhythm.   Pulmonary/Chest: Effort normal and breath sounds normal. No respiratory distress. He has no wheezes. He has no rales.  Musculoskeletal: He exhibits no edema.          Assessment & Plan:  Type 2 diabetes.  Improved control and at goal. Recheck in 6 months. Check lipid panel, urine microalbumin screen, and A1c then.

## 2014-10-25 ENCOUNTER — Other Ambulatory Visit: Payer: Self-pay | Admitting: Family Medicine

## 2014-10-27 ENCOUNTER — Other Ambulatory Visit: Payer: Self-pay | Admitting: Family Medicine

## 2014-12-07 ENCOUNTER — Encounter: Payer: Self-pay | Admitting: Family Medicine

## 2014-12-07 ENCOUNTER — Ambulatory Visit (INDEPENDENT_AMBULATORY_CARE_PROVIDER_SITE_OTHER): Payer: Medicare HMO | Admitting: Family Medicine

## 2014-12-07 VITALS — BP 130/80 | HR 78 | Temp 97.8°F | Wt 166.0 lb

## 2014-12-07 DIAGNOSIS — K219 Gastro-esophageal reflux disease without esophagitis: Secondary | ICD-10-CM | POA: Diagnosis not present

## 2014-12-07 DIAGNOSIS — N400 Enlarged prostate without lower urinary tract symptoms: Secondary | ICD-10-CM

## 2014-12-07 MED ORDER — TAMSULOSIN HCL 0.4 MG PO CAPS: 0.4000 mg | ORAL_CAPSULE | Freq: Every day | ORAL | Status: DC

## 2014-12-07 MED ORDER — PANTOPRAZOLE SODIUM 40 MG PO TBEC: 40.0000 mg | DELAYED_RELEASE_TABLET | Freq: Every day | ORAL | Status: DC

## 2014-12-07 NOTE — Progress Notes (Signed)
   Subjective:    Patient ID: Adrian Wood, male    DOB: [birth year], 81 y.o.   MRN: [id number]  HPI Patient seen today for the following 2 acute/subacute new issues  Heartburn. He states he has substernal burning which is especially bothersome after eating. He drinks one cup of coffee per day and usually about 2 beers per night. Acidic foods seem to aggravate. Denies any appetite or weight changes. No dysphagia. He is taken over-the-counter Tagamet for years but this is not seeming to help. No melanoma. No hematemesis. No history of documented esophagitis.  Progressive nocturia and slow urinary stream. Patient has suspected BPH the past. Frequent gets up 3 or 4 times at night. Stream is becoming slower. Does not take any anticholinergic medications. No exacerbating or alleviating factors.  Past Medical History  Diagnosis Date  . DIAB W/O COMP TYPE II/UNS NOT STATED UNCNTRL 11/09/2008  . DEPRESSION 11/09/2008  . Microscopic hematuria 11/16/2009  . Nonspecific (abnormal) findings on radiological and other examination of body structure 12/10/2009  . ABNORMAL CHEST XRAY 12/10/2009  . HYPERLIPIDEMIA 05/24/2010   No past surgical history on file.  reports that he quit smoking about 32 years ago. His smoking use included Cigarettes. He has a 30 pack-year smoking history. He has never used smokeless tobacco. He reports that he drinks about 12.6 oz of alcohol per week. He reports that he does not use illicit drugs. family history is not on file. No Known Allergies    Review of Systems  Constitutional: Negative for appetite change and unexpected weight change.  Respiratory: Negative for shortness of breath.   Cardiovascular: Negative for chest pain.  Gastrointestinal: Negative for nausea, vomiting, abdominal pain and diarrhea.  Genitourinary: Positive for decreased urine volume. Negative for hematuria and flank pain.  Neurological: Negative for dizziness.       Objective:   Physical Exam    Constitutional: He appears well-developed and well-nourished.  Cardiovascular: Normal rate and regular rhythm.   Pulmonary/Chest: Effort normal and breath sounds normal. No respiratory distress. He has no wheezes. He has no rales.  Abdominal: Soft. Bowel sounds are normal. He exhibits no distension and no mass. There is no tenderness. There is no rebound and no guarding.          Assessment & Plan:  #1 GERD. Lifestyle management discussed. Start Protonix 40 mg once daily. Reduce alcohol consumption. Avoid eating within 3 hours of bedtime. Consider elevate head of bed 6-8 inches. Handout given. Touch base in one month if symptoms not greatly improved #2 BPH. Progressive symptoms. Start Flomax 0.4 mg daily at bedtime. Avoid anticholinergic medications

## 2014-12-07 NOTE — Patient Instructions (Signed)
Food Choices for Gastroesophageal Reflux Disease When you have gastroesophageal reflux disease (GERD), the foods you eat and your eating habits are very important. Choosing the right foods can help ease the discomfort of GERD. WHAT GENERAL GUIDELINES DO I NEED TO FOLLOW?  Choose fruits, vegetables, whole grains, low-fat dairy products, and low-fat meat, fish, and poultry.  Limit fats such as oils, salad dressings, butter, nuts, and avocado.  Keep a food diary to identify foods that cause symptoms.  Avoid foods that cause reflux. These may be different for different people.  Eat frequent small meals instead of three large meals each day.  Eat your meals slowly, in a relaxed setting.  Limit fried foods.  Cook foods using methods other than frying.  Avoid drinking alcohol.  Avoid drinking large amounts of liquids with your meals.  Avoid bending over or lying down until 2-3 hours after eating. WHAT FOODS ARE NOT RECOMMENDED? The following are some foods and drinks that may worsen your symptoms: Vegetables Tomatoes. Tomato juice. Tomato and spaghetti sauce. Chili peppers. Onion and garlic. Horseradish. Fruits Oranges, grapefruit, and lemon (fruit and juice). Meats High-fat meats, fish, and poultry. This includes hot dogs, ribs, ham, sausage, salami, and bacon. Dairy Whole milk and chocolate milk. Sour cream. Cream. Butter. Ice cream. Cream cheese.  Beverages Coffee and tea, with or without caffeine. Carbonated beverages or energy drinks. Condiments Hot sauce. Barbecue sauce.  Sweets/Desserts Chocolate and cocoa. Donuts. Peppermint and spearmint. Fats and Oils High-fat foods, including [place] fries and potato chips. Other Vinegar. Strong spices, such as black pepper, white pepper, red pepper, cayenne, curry powder, cloves, ginger, and chili powder. The items listed above may not be a complete list of foods and beverages to avoid. Contact your dietitian for more  information. Document Released: 04/14/2005 Document Revised: 04/19/2013 Document Reviewed: 02/16/2013 [facility] Patient Information 2015 [place], [place]. This information is not intended to replace advice given to you by your health care provider. Make sure you discuss any questions you have with your health care provider. Benign Prostatic Hyperplasia An enlarged prostate (benign prostatic hyperplasia) is common in older men. You may experience the following:  Weak urine stream.  Dribbling.  Feeling like the bladder has not emptied completely.  Difficulty starting urination.  Getting up frequently at night to urinate.  Urinating more frequently during the day. HOME CARE INSTRUCTIONS  Monitor your prostatic hyperplasia for any changes. The following actions may help to alleviate any discomfort you are experiencing:  Give yourself time when you urinate.  Stay away from alcohol.  Avoid beverages containing caffeine, such as coffee, tea, and colas, because they can make the problem worse.  Avoid decongestants, antihistamines, and some prescription medicines that can make the problem worse.  Follow up with your health care provider for further treatment as recommended. SEEK MEDICAL CARE IF:  You are experiencing progressive difficulty voiding.  Your urine stream is progressively getting narrower.  You are awaking from sleep with the urge to void more frequently.  You are constantly feeling the need to void.  You experience loss of urine, especially in small amounts. SEEK IMMEDIATE MEDICAL CARE IF:   You develop increased pain with urination or are unable to urinate.  You develop severe abdominal pain, vomiting, a high fever, or fainting.  You develop back pain or blood in your urine. MAKE SURE YOU:   Understand these instructions.  Will watch your condition.  Will get help right away if you are not doing well or get worse.  Document Released: 04/14/2005 Document  Revised: 12/15/2012 Document Reviewed: 09/14/2012 [facility] Patient Information 2015 [place], [place]. This information is not intended to replace advice given to you by your health care provider. Make sure you discuss any questions you have with your health care provider.  Please touch base in 1-2 weeks if symptoms no better.

## 2014-12-07 NOTE — Progress Notes (Signed)
Pre visit review using our clinic review tool, if applicable. No additional management support is needed unless otherwise documented below in the visit note. 

## 2015-02-12 ENCOUNTER — Other Ambulatory Visit (INDEPENDENT_AMBULATORY_CARE_PROVIDER_SITE_OTHER): Payer: Medicare HMO

## 2015-02-12 DIAGNOSIS — IMO0002 Reserved for concepts with insufficient information to code with codable children: Secondary | ICD-10-CM

## 2015-02-12 DIAGNOSIS — E785 Hyperlipidemia, unspecified: Secondary | ICD-10-CM

## 2015-02-12 DIAGNOSIS — E1165 Type 2 diabetes mellitus with hyperglycemia: Secondary | ICD-10-CM | POA: Diagnosis not present

## 2015-02-12 LAB — LDL CHOLESTEROL, DIRECT: LDL DIRECT: 87 mg/dL

## 2015-02-12 LAB — LIPID PANEL
CHOLESTEROL: 155 mg/dL (ref 0–200)
HDL: 41.3 mg/dL (ref >39.00)
NonHDL: 113.54
Total CHOL/HDL Ratio: 4
Triglycerides: 231 mg/dL — ABNORMAL HIGH (ref 0.0–149.0)
VLDL: 46.2 mg/dL — ABNORMAL HIGH (ref 0.0–40.0)

## 2015-02-12 LAB — MICROALBUMIN / CREATININE URINE RATIO
Creatinine,U: 113.1 mg/dL
MICROALB/CREAT RATIO: 0.8 mg/g (ref 0.0–30.0)
Microalb, Ur: 0.9 mg/dL (ref 0.0–1.9)

## 2015-02-12 LAB — HEMOGLOBIN A1C: Hgb A1c MFr Bld: 7 % — ABNORMAL HIGH (ref 4.6–6.5)

## 2015-02-16 ENCOUNTER — Encounter: Payer: Self-pay | Admitting: Family Medicine

## 2015-02-16 ENCOUNTER — Ambulatory Visit (INDEPENDENT_AMBULATORY_CARE_PROVIDER_SITE_OTHER): Payer: Medicare HMO | Admitting: Family Medicine

## 2015-02-16 VITALS — BP 150/80 | HR 94 | Temp 98.5°F | Wt 171.5 lb

## 2015-02-16 DIAGNOSIS — K219 Gastro-esophageal reflux disease without esophagitis: Secondary | ICD-10-CM

## 2015-02-16 DIAGNOSIS — E1165 Type 2 diabetes mellitus with hyperglycemia: Secondary | ICD-10-CM | POA: Diagnosis not present

## 2015-02-16 DIAGNOSIS — E785 Hyperlipidemia, unspecified: Secondary | ICD-10-CM

## 2015-02-16 DIAGNOSIS — IMO0001 Reserved for inherently not codable concepts without codable children: Secondary | ICD-10-CM

## 2015-02-16 DIAGNOSIS — Z23 Encounter for immunization: Secondary | ICD-10-CM

## 2015-02-16 MED ORDER — PANTOPRAZOLE SODIUM 40 MG PO TBEC: 40.0000 mg | DELAYED_RELEASE_TABLET | Freq: Two times a day (BID) | ORAL | Status: DC

## 2015-02-16 NOTE — Assessment & Plan Note (Signed)
Type 2 diabetes well controlled. Continue current medications. Dietary issues discussed

## 2015-02-16 NOTE — Assessment & Plan Note (Signed)
Persistent GERD symptoms on current dose of Protonix 40 mg 1 daily. He is also supplemented with Tagamet. Increase Protonix to 40 mg twice daily with new prescription written. We discussed dietary modification

## 2015-02-16 NOTE — Progress Notes (Addendum)
   Subjective:    Patient ID: Adrian Wood, male    DOB: 12/04/1926, 81 y.o.   MRN: [id number]  HPI Patient seen for medical follow-up. He has type 2 diabetes and hyperlipidemia. Also history of BPH which is been stable on Flomax. Medications reviewed. Compliant with all. Recent A1c 7.0%. LDL well controlled. Needs flu vaccine. Generally feels well. Walks for exercise. No recent chest pains.  Does have history of GERD with recent breakthrough symptoms even taking Protonix 40 mg once daily and supplementing with Tagamet. He does drink some tea daily but no other specific food triggers.  Past Medical History  Diagnosis Date  . DIAB W/O COMP TYPE II/UNS NOT STATED UNCNTRL 11/09/2008  . DEPRESSION 11/09/2008  . Microscopic hematuria 11/16/2009  . Nonspecific (abnormal) findings on radiological and other examination of body structure 12/10/2009  . ABNORMAL CHEST XRAY 12/10/2009  . HYPERLIPIDEMIA 05/24/2010   No past surgical history on file.  reports that he quit smoking about 32 years ago. His smoking use included Cigarettes. He has a 30 pack-year smoking history. He has never used smokeless tobacco. He reports that he drinks about 12.6 oz of alcohol per week. He reports that he does not use illicit drugs. family history is not on file. No Known Allergies    Review of Systems  Constitutional: Negative for fatigue and unexpected weight change.  Eyes: Negative for visual disturbance.  Respiratory: Negative for cough, chest tightness and shortness of breath.   Cardiovascular: Negative for chest pain, palpitations and leg swelling.  Endocrine: Negative for polydipsia and polyuria.  Neurological: Negative for dizziness, syncope, weakness, light-headedness and headaches.       Objective:   Physical Exam  Constitutional: He is oriented to person, place, and time. He appears well-developed and well-nourished.  HENT:  Right Ear: External ear normal.  Left Ear: External ear normal.    Mouth/Throat: Oropharynx is clear and moist.  Eyes: Pupils are equal, round, and reactive to light.  Neck: Neck supple. No thyromegaly present.  Cardiovascular: Normal rate and regular rhythm.   Pulmonary/Chest: Effort normal and breath sounds normal. No respiratory distress. He has no wheezes. He has no rales.  Musculoskeletal: He exhibits no edema.  Neurological: He is alert and oriented to person, place, and time.          Assessment & Plan:  Elevated blood pressure. Mildly elevated today. This has been controlled in the past. Recent urine microalbumin normal. Recommend close screening over the next couple months. If consistently over 140/90 in touch for follow-up  Hyperlipidemia. Well controlled. Continue pravastatin  Type 2 diabetes. Good control. Continue current medications and close monitoring  GERD. Poorly controlled. Increase Protonix 40 mg twice daily. Dietary modification discussed. Touch base 2 weeks if not improving

## 2015-02-16 NOTE — Addendum Note (Signed)
Addended by: Ailene Rud E on: 02/16/2015 09:21 AM   Modules accepted: Orders

## 2015-02-16 NOTE — Patient Instructions (Signed)
Monitor blood pressure and be in touch if consistently > 140/90.   

## 2015-02-16 NOTE — Assessment & Plan Note (Signed)
Hyperlipidemia well controlled. Continue pravastatin

## 2015-02-16 NOTE — Progress Notes (Signed)
Pre visit review using our clinic review tool, if applicable. No additional management support is needed unless otherwise documented below in the visit note. 

## 2015-05-15 ENCOUNTER — Ambulatory Visit (INDEPENDENT_AMBULATORY_CARE_PROVIDER_SITE_OTHER): Payer: Medicare HMO | Admitting: Family Medicine

## 2015-05-15 VITALS — BP 158/90 | HR 90 | Temp 98.3°F | Ht 68.75 in | Wt 172.8 lb

## 2015-05-15 DIAGNOSIS — M25462 Effusion, left knee: Secondary | ICD-10-CM

## 2015-05-15 NOTE — Patient Instructions (Signed)
Knee Effusion Knee effusion means that you have excess fluid in your knee joint. This can cause pain and swelling in your knee. This may make your knee more difficult to bend and move. That is because there is increased pain and pressure in the joint. If there is fluid in your knee, it often means that something is wrong inside your knee, such as severe arthritis, abnormal inflammation, or an infection. Another common cause of knee effusion is an injury to the knee muscles, ligaments, or cartilage. HOME CARE INSTRUCTIONS  Use crutches as directed by your health care provider.  Wear a knee brace as directed by your health care provider.  Apply ice to the swollen area:  Put ice in a plastic bag.  Place a towel between your skin and the bag.  Leave the ice on for 20 minutes, 2-3 times per day.  Keep your knee raised (elevated) when you are sitting or lying down.  Take medicines only as directed by your health care provider.  Do any rehabilitation or strengthening exercises as directed by your health care provider.  Rest your knee as directed by your health care provider. You may start doing your normal activities again when your health care provider approves.   Keep all follow-up visits as directed by your health care provider. This is important. SEEK MEDICAL CARE IF:  You have ongoing (persistent) pain in your knee. SEEK IMMEDIATE MEDICAL CARE IF:  You have increased swelling or redness of your knee.  You have severe pain in your knee.  You have a fever.   This information is not intended to replace advice given to you by your health care provider. Make sure you discuss any questions you have with your health care provider.   Document Released: 07/05/2003 Document Revised: 05/05/2014 Document Reviewed: 11/28/2013 Elsevier Interactive Patient Education 2016 [place], compression, and ice  Let me know in 3 weeks if no better.

## 2015-05-15 NOTE — Progress Notes (Signed)
   Subjective:    Patient ID: Adrian Wood, male    DOB: Nov 07, 1926, 82 y.o.   MRN: [id number]  HPI  Acute visit. Patient seen with left knee pain and swelling for 1 week. Denies any injury. Possibly some mild warmth. No erythema or bruising. He had one episode where he felt like knee was locking on him. He has not had any sudden giving way. No history of gout or known pseudogout. He has been taking some Aleve and topical Voltaren gel. Has not been icing. He has elastic knee sleeve which he is not wearing consistently. Denies prior history of left knee difficulties. Generally very active  Past Medical History  Diagnosis Date  . DIAB W/O COMP TYPE II/UNS NOT STATED UNCNTRL 11/09/2008  . DEPRESSION 11/09/2008  . Microscopic hematuria 11/16/2009  . Nonspecific (abnormal) findings on radiological and other examination of body structure 12/10/2009  . ABNORMAL CHEST XRAY 12/10/2009  . HYPERLIPIDEMIA 05/24/2010   No past surgical history on file.  reports that he quit smoking about 32 years ago. His smoking use included Cigarettes. He has a 30 pack-year smoking history. He has never used smokeless tobacco. He reports that he drinks about 12.6 oz of alcohol per week. He reports that he does not use illicit drugs. family history is not on file. No Known Allergies   Review of Systems  Constitutional: Negative for fever and chills.       Objective:   Physical Exam  Constitutional: He appears well-developed and well-nourished.  Cardiovascular: Normal rate and regular rhythm.   Musculoskeletal:  Left knee reveals moderate effusion. He has full range of motion with flexion and extension. Mild popliteal swelling. Minimal warmth. No erythema. No ecchymosis. Minimal medial joint line tenderness. Ligament testing is normal. No lateral tenderness.          Assessment & Plan:  Left knee effusion. Differential would include small meniscal tear versus osteophyte avulsion versus other. Doubt this is  inflammatory such as gout or pseudogout. No signs of infection. We recommend icing along with elevation and continue cautious short term use of anti-inflammatory. Touch base in 2-3 weeks if not resolving/ improving.

## 2015-05-16 ENCOUNTER — Other Ambulatory Visit: Payer: Self-pay | Admitting: Family Medicine

## 2015-05-24 ENCOUNTER — Telehealth: Payer: Self-pay | Admitting: Family Medicine

## 2015-05-24 NOTE — Telephone Encounter (Signed)
Wife call to say husband is taking ALLEVE and she asking if they can have a rx for NAPROXEN. Wife said she think the naproxen will be better for him.   Pharmacy [facility] [address]

## 2015-05-25 MED ORDER — NAPROXEN 500 MG PO TABS: ORAL_TABLET | ORAL | Status: DC

## 2015-05-25 NOTE — Telephone Encounter (Signed)
Naproxen 500 mg one po q 12 hours prn with food.  #30 Try to avoid regular/prolonged use secondary to risk of GI bleed.

## 2015-05-25 NOTE — Telephone Encounter (Signed)
Wife is aware and Rx sent.

## 2015-06-21 ENCOUNTER — Other Ambulatory Visit: Payer: Self-pay | Admitting: Family Medicine

## 2015-07-17 ENCOUNTER — Other Ambulatory Visit: Payer: Self-pay | Admitting: Family Medicine

## 2015-08-17 ENCOUNTER — Ambulatory Visit (INDEPENDENT_AMBULATORY_CARE_PROVIDER_SITE_OTHER): Payer: Medicare HMO | Admitting: Family Medicine

## 2015-08-17 VITALS — BP 160/92 | HR 83 | Temp 97.7°F | Ht 68.75 in | Wt 167.0 lb

## 2015-08-17 DIAGNOSIS — E1165 Type 2 diabetes mellitus with hyperglycemia: Secondary | ICD-10-CM

## 2015-08-17 DIAGNOSIS — IMO0001 Reserved for inherently not codable concepts without codable children: Secondary | ICD-10-CM

## 2015-08-17 MED ORDER — LOSARTAN POTASSIUM 50 MG PO TABS: 50.0000 mg | ORAL_TABLET | Freq: Every day | ORAL | Status: DC

## 2015-08-17 NOTE — Patient Instructions (Signed)
Continue to monitor blood pressure and bring in readings for review.

## 2015-08-17 NOTE — Progress Notes (Signed)
   Subjective:    Patient ID: Adrian Wood, male    DOB: 02-Apr-1927, 82 y.o.   MRN: [id number]  HPI Follow-up regarding type 2 diabetes and elevated blood pressure  Never treated for hypertension. Several elevated readings recently. Brings a log for review today and he had several readings mostly above Q000111Q systolic and [id number] to 0000000 diastolic. No headaches. No dizziness. Has lost 6 pounds since last visit which wife attributes to reducing carbohydrate intake. Overall feels well.  Recent knee effusion resolved. He took naproxen briefly.  Type 2 diabetes. Controlled by home readings. Most recent A1c 7.0%. He remains on metformin and glimepiride. No recent polyuria or polydipsia. No hypoglycemia.  Past Medical History  Diagnosis Date  . DIAB W/O COMP TYPE II/UNS NOT STATED UNCNTRL 11/09/2008  . DEPRESSION 11/09/2008  . Microscopic hematuria 11/16/2009  . Nonspecific (abnormal) findings on radiological and other examination of body structure 12/10/2009  . ABNORMAL CHEST XRAY 12/10/2009  . HYPERLIPIDEMIA 05/24/2010   No past surgical history on file.  reports that he quit smoking about 33 years ago. His smoking use included Cigarettes. He has a 30 pack-year smoking history. He has never used smokeless tobacco. He reports that he drinks about 12.6 oz of alcohol per week. He reports that he does not use illicit drugs. family history is not on file. No Known Allergies    Review of Systems  Constitutional: Negative for fatigue.  Eyes: Negative for visual disturbance.  Respiratory: Negative for cough, chest tightness and shortness of breath.   Cardiovascular: Negative for chest pain, palpitations and leg swelling.  Gastrointestinal: Negative for abdominal pain.  Genitourinary: Negative for dysuria.  Neurological: Negative for dizziness, syncope, weakness, light-headedness and headaches.       Objective:   Physical Exam  Constitutional: He is oriented to person, place, and time. He appears  well-developed and well-nourished.  HENT:  Right Ear: External ear normal.  Left Ear: External ear normal.  Mouth/Throat: Oropharynx is clear and moist.  Eyes: Pupils are equal, round, and reactive to light.  Neck: Neck supple. No thyromegaly present.  Cardiovascular: Normal rate and regular rhythm.   Pulmonary/Chest: Effort normal and breath sounds normal. No respiratory distress. He has no wheezes. He has no rales.  Musculoskeletal: He exhibits no edema.  Neurological: He is alert and oriented to person, place, and time.          Assessment & Plan:  #1 elevated blood pressure. He's had several months now elevated readings here and at home. Start losartan 50 mg once daily. Reassess in one month and we'll plan to check basic metabolic panel then  #2 type 2 diabetes. History of good control. Recheck A1c with labs in one month. Continue current medications  #3 recent left knee effusion resolved   Eulas Post MD  [facility] at [facility]

## 2015-08-17 NOTE — Progress Notes (Signed)
Pre visit review using our clinic review tool, if applicable. No additional management support is needed unless otherwise documented below in the visit note. 

## 2015-09-10 ENCOUNTER — Other Ambulatory Visit: Payer: Self-pay

## 2015-09-10 MED ORDER — GLIMEPIRIDE 4 MG PO TABS: 4.0000 mg | ORAL_TABLET | Freq: Every day | ORAL | Status: DC

## 2015-09-14 ENCOUNTER — Ambulatory Visit (INDEPENDENT_AMBULATORY_CARE_PROVIDER_SITE_OTHER): Payer: Medicare HMO | Admitting: Family Medicine

## 2015-09-14 ENCOUNTER — Encounter: Payer: Self-pay | Admitting: Family Medicine

## 2015-09-14 VITALS — BP 140/80 | HR 67 | Temp 97.6°F | Ht 68.75 in | Wt 167.0 lb

## 2015-09-14 DIAGNOSIS — I10 Essential (primary) hypertension: Secondary | ICD-10-CM | POA: Insufficient documentation

## 2015-09-14 DIAGNOSIS — E785 Hyperlipidemia, unspecified: Secondary | ICD-10-CM

## 2015-09-14 DIAGNOSIS — IMO0001 Reserved for inherently not codable concepts without codable children: Secondary | ICD-10-CM

## 2015-09-14 DIAGNOSIS — E1165 Type 2 diabetes mellitus with hyperglycemia: Secondary | ICD-10-CM

## 2015-09-14 LAB — BASIC METABOLIC PANEL
BUN: 16 mg/dL (ref 6–23)
CALCIUM: 10.2 mg/dL (ref 8.4–10.5)
CO2: 26 meq/L (ref 19–32)
Chloride: 103 mEq/L (ref 96–112)
Creatinine, Ser: 1.33 mg/dL (ref 0.40–1.50)
GFR: 54.05 mL/min — AB (ref >60.00)
Glucose, Bld: 134 mg/dL — ABNORMAL HIGH (ref 70–99)
Potassium: 5.1 mEq/L (ref 3.5–5.1)
SODIUM: 138 meq/L (ref 135–145)

## 2015-09-14 LAB — HEMOGLOBIN A1C: HEMOGLOBIN A1C: 7.4 % — AB (ref 4.6–6.5)

## 2015-09-14 NOTE — Progress Notes (Signed)
Pre visit review using our clinic review tool, if applicable. No additional management support is needed unless otherwise documented below in the visit note. 

## 2015-09-14 NOTE — Patient Instructions (Signed)
We will call you with labs If stable, we will consider increasing the Losartan to 100 mg once daily.

## 2015-09-14 NOTE — Progress Notes (Signed)
   Subjective:    Patient ID: Adrian Wood, male    DOB: Jun 03, 1926, 82 y.o.   MRN: [id number]  HPI   Hypertension. Recent suboptimal control. Initiated losartan 50 mg once daily. Tolerating with no side effects. No dizziness or headaches. No chest pains. Walks minimally. Not much exercise. Home blood pressures reviewed. Consistently 0000000 to Q000111Q systolic Blood pressure last visit here 160/92. Compliant with therapy.  Type 2 diabetes. History of fair control. No polyuria or polydipsia. No recent hypoglycemia. Remains on glyburide/metformin combination.  Hyperlipidemia on pravastatin. No myalgias. No history of CAD.  Past Medical History  Diagnosis Date  . DIAB W/O COMP TYPE II/UNS NOT STATED UNCNTRL 11/09/2008  . DEPRESSION 11/09/2008  . Microscopic hematuria 11/16/2009  . Nonspecific (abnormal) findings on radiological and other examination of body structure 12/10/2009  . ABNORMAL CHEST XRAY 12/10/2009  . HYPERLIPIDEMIA 05/24/2010   No past surgical history on file.  reports that he quit smoking about 33 years ago. His smoking use included Cigarettes. He has a 30 pack-year smoking history. He has never used smokeless tobacco. He reports that he drinks about 12.6 oz of alcohol per week. He reports that he does not use illicit drugs. family history is not on file. No Known Allergies     Review of Systems  Constitutional: Positive for fatigue. Negative for unexpected weight change.  Eyes: Negative for visual disturbance.  Respiratory: Negative for cough, chest tightness and shortness of breath.   Cardiovascular: Negative for chest pain, palpitations and leg swelling.  Endocrine: Negative for polydipsia and polyuria.  Neurological: Negative for dizziness, syncope, weakness, light-headedness and headaches.       Objective:   Physical Exam  Constitutional: He appears well-developed and well-nourished.  Neck: Neck supple. No thyromegaly present.  Cardiovascular: Normal rate  and regular rhythm.   Pulmonary/Chest: Effort normal and breath sounds normal. No respiratory distress. He has no wheezes. He has no rales.  Musculoskeletal: He exhibits no edema.          Assessment & Plan:  Hypertension. Improved but not to goal. Titrate losartan 100 mg once daily if electrolytes stable. Check basic metabolic panel today  Type 2 diabetes. History of fair control. Recheck A1c  Dyslipidemia. Continue pravastatin  Eulas Post MD [facility] at [facility]

## 2015-09-15 ENCOUNTER — Encounter: Payer: Self-pay | Admitting: Family Medicine

## 2015-09-20 DIAGNOSIS — Z961 Presence of intraocular lens: Secondary | ICD-10-CM | POA: Diagnosis not present

## 2015-09-20 DIAGNOSIS — H353132 Nonexudative age-related macular degeneration, bilateral, intermediate dry stage: Secondary | ICD-10-CM | POA: Diagnosis not present

## 2015-09-20 DIAGNOSIS — E119 Type 2 diabetes mellitus without complications: Secondary | ICD-10-CM | POA: Diagnosis not present

## 2015-09-20 DIAGNOSIS — H5213 Myopia, bilateral: Secondary | ICD-10-CM | POA: Diagnosis not present

## 2015-09-20 LAB — HM DIABETES EYE EXAM

## 2015-09-25 ENCOUNTER — Encounter: Payer: Self-pay | Admitting: Family Medicine

## 2015-12-19 ENCOUNTER — Other Ambulatory Visit: Payer: Self-pay | Admitting: Family Medicine

## 2015-12-25 ENCOUNTER — Other Ambulatory Visit: Payer: Self-pay | Admitting: Family Medicine

## 2015-12-25 MED ORDER — GLIMEPIRIDE 4 MG PO TABS: 4.0000 mg | ORAL_TABLET | Freq: Every day | ORAL | 0 refills | Status: DC

## 2015-12-25 NOTE — Telephone Encounter (Signed)
Sent to the pharmacy by e-scribe for 90 days.  Pt has upcoming follow up on 02/15/16

## 2016-01-29 ENCOUNTER — Other Ambulatory Visit: Payer: Self-pay | Admitting: Family Medicine

## 2016-02-15 ENCOUNTER — Ambulatory Visit (INDEPENDENT_AMBULATORY_CARE_PROVIDER_SITE_OTHER): Payer: Medicare HMO | Admitting: Family Medicine

## 2016-02-15 ENCOUNTER — Other Ambulatory Visit: Payer: Self-pay | Admitting: Internal Medicine

## 2016-02-15 ENCOUNTER — Telehealth: Payer: Self-pay | Admitting: Family Medicine

## 2016-02-15 ENCOUNTER — Encounter: Payer: Self-pay | Admitting: Family Medicine

## 2016-02-15 ENCOUNTER — Other Ambulatory Visit: Payer: Self-pay | Admitting: Family Medicine

## 2016-02-15 VITALS — BP 148/78 | HR 92 | Temp 98.2°F | Ht 68.75 in | Wt 170.2 lb

## 2016-02-15 DIAGNOSIS — E785 Hyperlipidemia, unspecified: Secondary | ICD-10-CM | POA: Diagnosis not present

## 2016-02-15 DIAGNOSIS — Z23 Encounter for immunization: Secondary | ICD-10-CM

## 2016-02-15 DIAGNOSIS — I10 Essential (primary) hypertension: Secondary | ICD-10-CM

## 2016-02-15 DIAGNOSIS — E1165 Type 2 diabetes mellitus with hyperglycemia: Secondary | ICD-10-CM

## 2016-02-15 DIAGNOSIS — IMO0001 Reserved for inherently not codable concepts without codable children: Secondary | ICD-10-CM

## 2016-02-15 LAB — LIPID PANEL
CHOL/HDL RATIO: 3
Cholesterol: 137 mg/dL (ref 0–200)
HDL: 40.4 mg/dL (ref >39.00)
NONHDL: 96.21
Triglycerides: 238 mg/dL — ABNORMAL HIGH (ref 0.0–149.0)
VLDL: 47.6 mg/dL — AB (ref 0.0–40.0)

## 2016-02-15 LAB — HEPATIC FUNCTION PANEL
ALK PHOS: 88 U/L (ref 39–117)
ALT: 15 U/L (ref 0–53)
AST: 15 U/L (ref 0–37)
Albumin: 4.7 g/dL (ref 3.5–5.2)
BILIRUBIN DIRECT: 0.1 mg/dL (ref 0.0–0.3)
BILIRUBIN TOTAL: 0.5 mg/dL (ref 0.2–1.2)
Total Protein: 8 g/dL (ref 6.0–8.3)

## 2016-02-15 LAB — BASIC METABOLIC PANEL
BUN: 16 mg/dL (ref 6–23)
CHLORIDE: 104 meq/L (ref 96–112)
CO2: 28 mEq/L (ref 19–32)
CREATININE: 1.33 mg/dL (ref 0.40–1.50)
Calcium: 10.4 mg/dL (ref 8.4–10.5)
GFR: 54 mL/min — ABNORMAL LOW (ref >60.00)
GLUCOSE: 130 mg/dL — AB (ref 70–99)
POTASSIUM: 5 meq/L (ref 3.5–5.1)
Sodium: 139 mEq/L (ref 135–145)

## 2016-02-15 LAB — LDL CHOLESTEROL, DIRECT: Direct LDL: 77 mg/dL

## 2016-02-15 LAB — HEMOGLOBIN A1C: Hgb A1c MFr Bld: 7.3 % — ABNORMAL HIGH (ref 4.6–6.5)

## 2016-02-15 MED ORDER — AMLODIPINE BESYLATE 5 MG PO TABS: 5.0000 mg | ORAL_TABLET | Freq: Every day | ORAL | 3 refills | Status: DC

## 2016-02-15 NOTE — Telephone Encounter (Signed)
What dose should he take?

## 2016-02-15 NOTE — Progress Notes (Signed)
Subjective:     Patient ID: Adrian Wood, male   DOB: 1926-11-04, 82 y.o.   MRN: [id number]  HPI Patient seen for medical follow-up. He has history of GERD, type 2 diabetes, hypertension, BPH. They bring a log of blood pressures and they have been consistently elevated with frequent systolic readings over 0000000 over the past couple months. No headaches. No dizziness. No chest pains. Takes losartan 50 mg once daily  Diabetes has been fairly well controlled. Last A1c 7.4%. No hypoglycemia. No polyuria. Gets very little exercise.  Long history of GERD. No dysphagia. He is on per tonics and also supplements with Tagamet. His wife states he has frequent burping. She thinks he is eating too fast.  Hyperlipidemia treated with pravastatin. No myalgias.  Past Medical History:  Diagnosis Date  . ABNORMAL CHEST XRAY 12/10/2009  . DEPRESSION 11/09/2008  . DIAB W/O COMP TYPE II/UNS NOT STATED UNCNTRL 11/09/2008  . HYPERLIPIDEMIA 05/24/2010  . Microscopic hematuria 11/16/2009  . Nonspecific (abnormal) findings on radiological and other examination of body structure 12/10/2009   No past surgical history on file.  reports that he quit smoking about 33 years ago. His smoking use included Cigarettes. He has a 30.00 pack-year smoking history. He has never used smokeless tobacco. He reports that he drinks about 12.6 oz of alcohol per week . He reports that he does not use drugs. family history is not on file. No Known Allergies   Review of Systems  Constitutional: Negative for fatigue.  Eyes: Negative for visual disturbance.  Respiratory: Negative for cough, chest tightness and shortness of breath.   Cardiovascular: Negative for chest pain, palpitations and leg swelling.  Endocrine: Negative for polydipsia and polyuria.  Genitourinary: Negative for dysuria.  Neurological: Negative for dizziness, syncope, weakness, light-headedness and headaches.       Objective:   Physical Exam  Constitutional: He is  oriented to person, place, and time. He appears well-developed and well-nourished.  HENT:  Right Ear: External ear normal.  Left Ear: External ear normal.  Mouth/Throat: Oropharynx is clear and moist.  Eyes: Pupils are equal, round, and reactive to light.  Neck: Neck supple. No thyromegaly present.  Cardiovascular: Normal rate and regular rhythm.   Pulmonary/Chest: Effort normal and breath sounds normal. No respiratory distress. He has no wheezes. He has no rales.  Musculoskeletal: He exhibits no edema.  Neurological: He is alert and oriented to person, place, and time.       Assessment:     #1 hypertension. Suboptimally controlled with several recent systolic readings over 0000000  #2 type 2 diabetes. History of fair to good control  #3 hyperlipidemia  #4 GERD    Plan:     -Recheck labs with lipid panel, hepatic panel, hemoglobin [id number], basic metabolic panel -Flu vaccine given -Start amlodipine 5 mg once daily and reassess blood pressure 4 weeks -He is encouraged to eat more slowly and increase water consumption-wife states he currently only drinks about 3 glasses of tea per day and almost no water intake  Eulas Post MD [facility] at [facility]

## 2016-02-15 NOTE — Telephone Encounter (Signed)
Pt has lots of  omeprazole DR 10 at home and wants to know if ok to take that instead of the  Nexium. If so, how may a day? Wife had this left over from an issue prior.

## 2016-02-15 NOTE — Telephone Encounter (Signed)
Our med list shows Protonix- so he should not take Nexium or Omeprazole with that.  He may take Pepcid, Tagamet, or Zantac as supplement to Protonix.

## 2016-02-18 NOTE — Telephone Encounter (Signed)
Pt's wife is aware of his options and not to take Omeprazole.

## 2016-03-14 ENCOUNTER — Encounter: Payer: Self-pay | Admitting: Family Medicine

## 2016-03-14 ENCOUNTER — Ambulatory Visit (INDEPENDENT_AMBULATORY_CARE_PROVIDER_SITE_OTHER): Payer: Medicare HMO | Admitting: Family Medicine

## 2016-03-14 VITALS — BP 130/68 | HR 88 | Temp 98.0°F | Ht 68.75 in | Wt 172.5 lb

## 2016-03-14 DIAGNOSIS — I10 Essential (primary) hypertension: Secondary | ICD-10-CM | POA: Diagnosis not present

## 2016-03-14 NOTE — Progress Notes (Signed)
Pre visit review using our clinic review tool, if applicable. No additional management support is needed unless otherwise documented below in the visit note. 

## 2016-03-14 NOTE — Progress Notes (Signed)
Subjective:     Patient ID: Adrian Wood, male   DOB: Nov 17, 1926, 82 y.o.   MRN: [id number]  HPI Patient here for follow-up hypertension. We added amlodipine last visit. Home blood pressures vary considerably but mostly ranged Q000111Q systolic to around 99991111. No dizziness. No headaches. No peripheral edema. Also remains on losartan. Not exercising regularly. No recent chest pains. Compliant with therapy.  Past Medical History:  Diagnosis Date  . ABNORMAL CHEST XRAY 12/10/2009  . DEPRESSION 11/09/2008  . DIAB W/O COMP TYPE II/UNS NOT STATED UNCNTRL 11/09/2008  . HYPERLIPIDEMIA 05/24/2010  . Microscopic hematuria 11/16/2009  . Nonspecific (abnormal) findings on radiological and other examination of body structure 12/10/2009   No past surgical history on file.  reports that he quit smoking about 33 years ago. His smoking use included Cigarettes. He has a 30.00 pack-year smoking history. He has never used smokeless tobacco. He reports that he drinks about 12.6 oz of alcohol per week . He reports that he does not use drugs. family history is not on file. No Known Allergies   Review of Systems  Constitutional: Negative for fatigue.  Eyes: Negative for visual disturbance.  Respiratory: Negative for cough, chest tightness and shortness of breath.   Cardiovascular: Negative for chest pain, palpitations and leg swelling.  Endocrine: Negative for polydipsia and polyuria.  Neurological: Negative for dizziness, syncope, weakness, light-headedness and headaches.       Objective:   Physical Exam  Constitutional: He is oriented to person, place, and time. He appears well-developed and well-nourished.  HENT:  Right Ear: External ear normal.  Left Ear: External ear normal.  Mouth/Throat: Oropharynx is clear and moist.  Eyes: Pupils are equal, round, and reactive to light.  Neck: Neck supple. No thyromegaly present.  Cardiovascular: Normal rate and regular rhythm.   Pulmonary/Chest: Effort normal and  breath sounds normal. No respiratory distress. He has no wheezes. He has no rales.  Musculoskeletal: He exhibits no edema.  Neurological: He is alert and oriented to person, place, and time.       Assessment:     Hypertension. Improved. Repeat left arm seated by me 130/68    Plan:     -Continue current medication plan -Establish more consistent walking for exercise -Routine follow-up in 4 months  Eulas Post MD [facility] at [facility]

## 2016-03-17 ENCOUNTER — Encounter: Payer: Self-pay | Admitting: Family Medicine

## 2016-03-18 ENCOUNTER — Other Ambulatory Visit: Payer: Self-pay

## 2016-03-18 DIAGNOSIS — E1165 Type 2 diabetes mellitus with hyperglycemia: Principal | ICD-10-CM

## 2016-03-18 DIAGNOSIS — IMO0001 Reserved for inherently not codable concepts without codable children: Secondary | ICD-10-CM

## 2016-03-18 DIAGNOSIS — R69 Illness, unspecified: Secondary | ICD-10-CM | POA: Diagnosis not present

## 2016-03-18 MED ORDER — GLUCOSE BLOOD VI STRP: ORAL_STRIP | 2 refills | Status: AC

## 2016-04-23 ENCOUNTER — Ambulatory Visit (INDEPENDENT_AMBULATORY_CARE_PROVIDER_SITE_OTHER): Payer: Medicare HMO | Admitting: Family Medicine

## 2016-04-23 ENCOUNTER — Encounter: Payer: Self-pay | Admitting: Family Medicine

## 2016-04-23 VITALS — BP 162/80 | HR 104 | Temp 97.8°F | Ht 68.75 in | Wt 172.0 lb

## 2016-04-23 DIAGNOSIS — M25472 Effusion, left ankle: Secondary | ICD-10-CM

## 2016-04-23 DIAGNOSIS — I10 Essential (primary) hypertension: Secondary | ICD-10-CM

## 2016-04-23 DIAGNOSIS — M25471 Effusion, right ankle: Secondary | ICD-10-CM

## 2016-04-23 DIAGNOSIS — R6889 Other general symptoms and signs: Secondary | ICD-10-CM

## 2016-04-23 LAB — TSH: TSH: 3.98 u[IU]/mL (ref 0.35–4.50)

## 2016-04-23 MED ORDER — HYDROCHLOROTHIAZIDE 12.5 MG PO CAPS: 12.5000 mg | ORAL_CAPSULE | Freq: Every day | ORAL | 5 refills | Status: DC

## 2016-04-23 NOTE — Progress Notes (Signed)
Subjective:     Patient ID: Adrian Wood, male   DOB: 06-09-1926, 82 y.o.   MRN: [id number]  HPI Patient seen for follow-up with some recent edema issues. He was taking amlodipine 5 mg daily in addition to losartan. Blood pressure was well-controlled but he had daily ankle edema which was worse late in the day. He stopped amlodipine about a week ago and edema has improved. His weight is stable. No orthopnea. He is currently only taking losartan for hypertension. Blood pressures been up around Q000111Q systolic since stopping the amlodipine.  No chest pains. No headaches. Wife does relate he's having frequent symptoms of cold intolerance and increasing fatigue. He has not had any recent thyroid checks.  Past Medical History:  Diagnosis Date  . ABNORMAL CHEST XRAY 12/10/2009  . DEPRESSION 11/09/2008  . DIAB W/O COMP TYPE II/UNS NOT STATED UNCNTRL 11/09/2008  . HYPERLIPIDEMIA 05/24/2010  . Microscopic hematuria 11/16/2009  . Nonspecific (abnormal) findings on radiological and other examination of body structure 12/10/2009   No past surgical history on file.  reports that he quit smoking about 33 years ago. His smoking use included Cigarettes. He has a 30.00 pack-year smoking history. He has never used smokeless tobacco. He reports that he drinks about 12.6 oz of alcohol per week . He reports that he does not use drugs. family history is not on file. Allergies  Allergen Reactions  . Amlodipine Swelling     Review of Systems  Constitutional: Positive for fatigue. Negative for activity change, appetite change, chills, fever and unexpected weight change.  HENT: Negative for congestion, ear pain, sore throat and trouble swallowing.   Eyes: Negative for visual disturbance.  Respiratory: Negative for cough, chest tightness, shortness of breath, wheezing and stridor.   Cardiovascular: Positive for leg swelling. Negative for chest pain and palpitations.  Gastrointestinal: Negative for abdominal pain.   Endocrine: Positive for cold intolerance.  Musculoskeletal: Negative for arthralgias.  Skin: Negative for rash.  Neurological: Negative for dizziness, syncope, weakness, light-headedness and headaches.  Hematological: Negative for adenopathy.       Objective:   Physical Exam  Constitutional: He is oriented to person, place, and time. He appears well-developed and well-nourished.  HENT:  Right Ear: External ear normal.  Left Ear: External ear normal.  Mouth/Throat: Oropharynx is clear and moist.  Eyes: Pupils are equal, round, and reactive to light.  Neck: Neck supple. No thyromegaly present.  Cardiovascular: Normal rate and regular rhythm.   Pulmonary/Chest: Effort normal and breath sounds normal. No respiratory distress. He has no wheezes. He has no rales.  Musculoskeletal: He exhibits no edema.  Neurological: He is alert and oriented to person, place, and time.       Assessment:     #1 hypertension. Up after stopping amlodipine  #2 recent mild peripheral edema probably related to calcium channel blocker  #3 symptoms of cold intolerance and fatigue. Rule out hypothyroidism    Plan:     -Check TSH -Discontinue amlodipine -Start HCTZ 12.5 mg daily and continue losartan -Follow-up to reassess blood pressure one month and recheck basic metabolic panel then -High potassium diet and watch sodium intake  Eulas Post MD [facility] at [facility]

## 2016-04-23 NOTE — Patient Instructions (Signed)

## 2016-04-25 ENCOUNTER — Telehealth: Payer: Self-pay | Admitting: Family Medicine

## 2016-04-25 NOTE — Telephone Encounter (Signed)
Spoke with pt's wife and scheduled appt for 04/29/16 with Dr Elease Hashimoto. Advised her that if symptoms worsen to call office or seek ED care. Nothing further needed at this time.

## 2016-04-25 NOTE — Telephone Encounter (Signed)
[facility] - Client [facility] Patient Name: STEPHENS VANROOYEN DOB: May 10, 1926 Initial Comment Husband is tired, cold, no energy and just doesn't feel good. Had blood testing done 2 days ago Nurse Assessment Nurse: Dimas Chyle, RN, Dellis Filbert Date/Time Eilene Ghazi Time): 04/25/2016 10:50:27 AM Confirm and document reason for call. If symptomatic, describe symptoms. ---Husband is tired, cold, no energy and just doesn't feel good. Had blood testing done 2 days ago. Blood testing for thyroid. Symptoms for 2 weeks. Does the patient have any new or worsening symptoms? ---Yes Will a triage be completed? ---Yes Related visit to physician within the last 2 weeks? ---Yes Does the PT have any chronic conditions? (i.e. diabetes, asthma, etc.) ---Yes List chronic conditions. ---HTN, Diabetes type 2 Is this a behavioral health or substance abuse call? ---No Guidelines Guideline Title Affirmed Question Affirmed Notes Weakness (Generalized) and Fatigue [1] Fatigue (i.e., tires easily, decreased energy) AND [2] persists > 1 week Final Disposition User See PCP When Office is Open (within 3 days) Dimas Chyle, Therapist, sports, [employer] with office and lab work from [facility] had came back and was in normal range. Patient has follow up with PCP scheduled. Disagree/Comply: Comply

## 2016-04-29 ENCOUNTER — Ambulatory Visit (INDEPENDENT_AMBULATORY_CARE_PROVIDER_SITE_OTHER): Payer: Medicare HMO | Admitting: Family Medicine

## 2016-04-29 ENCOUNTER — Encounter: Payer: Self-pay | Admitting: Family Medicine

## 2016-04-29 VITALS — BP 142/56 | HR 95 | Temp 97.8°F | Ht 68.75 in | Wt 171.8 lb

## 2016-04-29 DIAGNOSIS — R5383 Other fatigue: Secondary | ICD-10-CM | POA: Diagnosis not present

## 2016-04-29 DIAGNOSIS — R6889 Other general symptoms and signs: Secondary | ICD-10-CM

## 2016-04-29 LAB — CBC WITH DIFFERENTIAL/PLATELET
Basophils Absolute: 0 10*3/uL (ref 0.0–0.1)
Basophils Relative: 0.4 % (ref 0.0–3.0)
EOS PCT: 1.2 % (ref 0.0–5.0)
Eosinophils Absolute: 0.1 10*3/uL (ref 0.0–0.7)
HCT: 31.3 % — ABNORMAL LOW (ref 39.0–52.0)
Hemoglobin: 10 g/dL — ABNORMAL LOW (ref 13.0–17.0)
LYMPHS ABS: 1.7 10*3/uL (ref 0.7–4.0)
Lymphocytes Relative: 20 % (ref 12.0–46.0)
MCHC: 32 g/dL (ref 30.0–36.0)
MCV: 71.6 fl — ABNORMAL LOW (ref 78.0–100.0)
MONO ABS: 0.7 10*3/uL (ref 0.1–1.0)
Monocytes Relative: 8.4 % (ref 3.0–12.0)
Neutro Abs: 6 10*3/uL (ref 1.4–7.7)
Neutrophils Relative %: 70 % (ref 43.0–77.0)
PLATELETS: 401 10*3/uL — AB (ref 150.0–400.0)
RBC: 4.37 Mil/uL (ref 4.22–5.81)
RDW: 19.4 % — ABNORMAL HIGH (ref 11.5–15.5)
WBC: 8.5 10*3/uL (ref 4.0–10.5)

## 2016-04-29 NOTE — Progress Notes (Signed)
Subjective:     Patient ID: Adrian Wood, male   DOB: [birth year], 83 y.o.   MRN: [id number]  HPI Patient is seen with persistent fatigue and cold intolerance. Refer to recent note. We obtain TSH which was normal. His appetite and weight are stable. He has not had any fever or chills. He feels "cold "most the time. Occasional lightheadedness but no consistent positional dizziness. Generally sleeps poorly but this is not a new finding. No recent chest pains. No abdominal pain. Denies any melena or bloody stools. Mood is stable. Blood sugars have been stable. No history of anemia  Past Medical History:  Diagnosis Date  . ABNORMAL CHEST XRAY 12/10/2009  . DEPRESSION 11/09/2008  . DIAB W/O COMP TYPE II/UNS NOT STATED UNCNTRL 11/09/2008  . HYPERLIPIDEMIA 05/24/2010  . Microscopic hematuria 11/16/2009  . Nonspecific (abnormal) findings on radiological and other examination of body structure 12/10/2009   No past surgical history on file.  reports that he quit smoking about 33 years ago. His smoking use included Cigarettes. He has a 30.00 pack-year smoking history. He has never used smokeless tobacco. He reports that he drinks about 12.6 oz of alcohol per week . He reports that he does not use drugs. family history is not on file. Allergies  Allergen Reactions  . Amlodipine Swelling       Review of Systems  Constitutional: Positive for fatigue. Negative for appetite change, chills, fever and unexpected weight change.  Respiratory: Negative for shortness of breath.   Cardiovascular: Negative for chest pain.  Gastrointestinal: Negative for abdominal pain, blood in stool, nausea and vomiting.  Endocrine: Positive for cold intolerance.  Genitourinary: Negative for dysuria.  Neurological: Positive for light-headedness.       Objective:   Physical Exam  Constitutional: He appears well-developed and well-nourished.  Neck: Neck supple. No thyromegaly present.  Cardiovascular: Normal rate and  regular rhythm.   Pulmonary/Chest: Effort normal and breath sounds normal. No respiratory distress. He has no wheezes. He has no rales.  Neurological: He is alert.  Skin:  Nailbeds are slightly pale       Assessment:     Patient presents with persistent symptoms of fatigue and cold intolerance. Recent TSH normal. No recent change in medication. Rule out anemia    Plan:     -Check CBC -We'll discuss possible further evaluation if abnormal -Routine follow-up scheduled for March  Eulas Post MD [facility] at [facility]

## 2016-04-29 NOTE — Progress Notes (Signed)
Pre visit review using our clinic review tool, if applicable. No additional management support is needed unless otherwise documented below in the visit note. 

## 2016-04-30 ENCOUNTER — Other Ambulatory Visit: Payer: Self-pay | Admitting: Emergency Medicine

## 2016-04-30 DIAGNOSIS — D649 Anemia, unspecified: Secondary | ICD-10-CM

## 2016-05-06 ENCOUNTER — Other Ambulatory Visit (INDEPENDENT_AMBULATORY_CARE_PROVIDER_SITE_OTHER): Payer: Medicare HMO

## 2016-05-06 DIAGNOSIS — D649 Anemia, unspecified: Secondary | ICD-10-CM | POA: Diagnosis not present

## 2016-05-06 LAB — FERRITIN: FERRITIN: 5.8 ng/mL — AB (ref 22.0–322.0)

## 2016-05-06 LAB — VITAMIN B12: VITAMIN B 12: 395 pg/mL (ref 211–911)

## 2016-05-07 LAB — IRON AND TIBC
%SAT: 6 % — AB (ref 15–60)
Iron: 21 ug/dL — ABNORMAL LOW (ref 50–180)
TIBC: 330 ug/dL (ref 250–425)
UIBC: 309 ug/dL (ref 125–400)

## 2016-05-08 ENCOUNTER — Encounter: Payer: Self-pay | Admitting: Family Medicine

## 2016-05-09 ENCOUNTER — Other Ambulatory Visit: Payer: Self-pay

## 2016-05-09 ENCOUNTER — Other Ambulatory Visit: Payer: Self-pay | Admitting: Family Medicine

## 2016-05-09 DIAGNOSIS — R790 Abnormal level of blood mineral: Secondary | ICD-10-CM

## 2016-05-13 ENCOUNTER — Other Ambulatory Visit (INDEPENDENT_AMBULATORY_CARE_PROVIDER_SITE_OTHER): Payer: Medicare HMO

## 2016-05-13 ENCOUNTER — Encounter: Payer: Self-pay | Admitting: Gastroenterology

## 2016-05-13 ENCOUNTER — Ambulatory Visit (INDEPENDENT_AMBULATORY_CARE_PROVIDER_SITE_OTHER): Payer: Medicare HMO | Admitting: Gastroenterology

## 2016-05-13 VITALS — BP 134/68 | HR 104 | Ht 68.75 in | Wt 172.4 lb

## 2016-05-13 DIAGNOSIS — R1013 Epigastric pain: Secondary | ICD-10-CM

## 2016-05-13 DIAGNOSIS — D509 Iron deficiency anemia, unspecified: Secondary | ICD-10-CM

## 2016-05-13 LAB — IGA: IgA: 174 mg/dL (ref 68–378)

## 2016-05-13 NOTE — Progress Notes (Signed)
HPI: This is a  very pleasant 83 year old man  who was referred to me by Eulas Post, MD  to evaluate  iron deficiency anemia, dyspepsia .    Chief complaint is iron deficiency anemia, dyspepsia  He has recently been a bit fatigued and overall dyspeptic. His primary care physician has drawn some labs. See those below. He has also tried a variety of antiacid maneuvers including proton pump inhibitors. He doesn't think proton pump inhibitors haven't helped his belching, bloating however H2 blocker and Tums does seem to help at times. He is bothered by the symptoms after he eats. He has had no nausea or vomiting. He has had no fevers or chills.  Lab tests January 2018 show hemoglobin 10.0, MCV 71.6, ferritin 5.8.  No overt blood in stool.  1970 egd;   Very rare alleve.  Overall his weight has been stable.  No colon or stomach disease in his family.  He's had belching, dyspepsia for 3 months.    Pyrosis  Takes protonix in AM before breakfast.    Review of systems: Pertinent positive and negative review of systems were noted in the above HPI section. Complete review of systems was performed and was otherwise normal.   Past Medical History:  Diagnosis Date  . ABNORMAL CHEST XRAY 12/10/2009  . Borderline hypertension   . DEPRESSION 11/09/2008  . DIAB W/O COMP TYPE II/UNS NOT STATED UNCNTRL 11/09/2008  . GERD (gastroesophageal reflux disease)   . History of gastric ulcer   . HYPERLIPIDEMIA 05/24/2010  . Microscopic hematuria 11/16/2009  . Nonspecific (abnormal) findings on radiological and other examination of body structure 12/10/2009    Past Surgical History:  Procedure Laterality Date  . None      Current Outpatient Prescriptions  Medication Sig Dispense Refill  . aspirin 81 MG tablet Take 81 mg by mouth every morning.     . cimetidine (TAGAMET) 200 MG tablet Take 200 mg by mouth 2 (two) times daily.     Marland Kitchen Cod Liver Oil 10 MINIM CAPS Take 1 capsule by mouth every  morning.     . cyanocobalamin 500 MCG tablet Take 500 mcg by mouth daily.    Marland Kitchen glimepiride (AMARYL) 4 MG tablet TAKE ONE TABLET BY MOUTH DAILY BEFORE BREAKFAST 90 tablet 2  . glucose blood test strip Check blood sugars twice per day. DX: E11.65 300 each 2  . hydrochlorothiazide (MICROZIDE) 12.5 MG capsule Take 1 capsule (12.5 mg total) by mouth daily. 30 capsule 5  . losartan (COZAAR) 50 MG tablet Take 1 tablet (50 mg total) by mouth daily. 90 tablet 3  . metFORMIN (GLUCOPHAGE) 500 MG tablet TAKE 2 TABLETS TWICE DAILY WITH MEALS 360 tablet 3  . Multiple Vitamins-Minerals (MULTIVITAMIN WITH MINERALS) tablet Take 1 tablet by mouth daily.      . pantoprazole (PROTONIX) 40 MG tablet TAKE 1 TABLET (40 MG TOTAL) BY MOUTH 2 (TWO) TIMES DAILY. 180 tablet 2  . polyethylene glycol powder (GLYCOLAX/MIRALAX) powder Take 17 g by mouth every evening. Use as directed    . pravastatin (PRAVACHOL) 20 MG tablet TAKE 1 TABLET BY MOUTH DAILY 90 tablet 3  . tamsulosin (FLOMAX) 0.4 MG CAPS capsule TAKE 1 CAPSULE (0.4 MG TOTAL) BY MOUTH DAILY. 90 capsule 3   No current facility-administered medications for this visit.     Allergies as of 05/13/2016 - Review Complete 05/13/2016  Allergen Reaction Noted  . Amlodipine Swelling 04/23/2016    Family History  Problem Relation Age of  Onset  . Heart attack Father   . Lung cancer Sister   . Colon cancer Neg Hx   . Stomach cancer Neg Hx   . Esophageal cancer Neg Hx   . Rectal cancer Neg Hx   . Liver cancer Neg Hx     Social History   Social History  . Marital status: Married    Spouse name: N/A  . Number of children: 2  . Years of education: N/A   Occupational History  . retired    Social History Main Topics  . Smoking status: Former Smoker    Packs/day: 1.00    Years: 30.00    Types: Cigarettes    Quit date: 08/21/1982  . Smokeless tobacco: Never Used  . Alcohol use 1.2 oz/week    2 Shots of liquor per week     Comment: once a week  . Drug use:  No  . Sexual activity: Not on file   Other Topics Concern  . Not on file   Social History Narrative  . No narrative on file     Physical Exam: BP 134/68   Pulse (!) 104   Ht 5' 8.75" (1.746 m)   Wt 172 lb 6 oz (78.2 kg)   BMI 25.64 kg/m  Constitutional: generally well-appearing Psychiatric: alert and oriented x3 Eyes: extraocular movements intact Mouth: oral pharynx moist, no lesions Neck: supple no lymphadenopathy Cardiovascular: heart regular rate and rhythm Lungs: clear to auscultation bilaterally Abdomen: soft, nontender, nondistended, no obvious ascites, no peritoneal signs, normal bowel sounds Extremities: no lower extremity edema bilaterally Skin: no lesions on visible extremities   Assessment and plan: 83 y.o. male with  iron deficiency anemia, 2-3 months of dyspepsia  I am not sure if these 2 issues of his are related or not.  I recommended we proceed with upper endoscopy at his soonest convenience to check for gastritis, gastric ulcer, neoplasm, H. pylori infection, celiac sprue. He will also have labs done today for celiac sprue testing. I recommend a trial of Gas-X, one pill with every meal for now.   Owens Loffler, MD [facility] 05/13/2016, 3:01 PM  Cc: Eulas Post, MD

## 2016-05-13 NOTE — Patient Instructions (Signed)
You will have labs checked today in the basement lab.  Please head down after you check out with the front desk  (tTG, total IgA). Try a gas ex pill, one with every meal. You will be set up for an upper endoscopy for dyspepsia.

## 2016-05-15 LAB — TISSUE TRANSGLUTAMINASE, IGA: TISSUE TRANSGLUTAMINASE AB, IGA: 1 U/mL (ref <4)

## 2016-05-26 ENCOUNTER — Ambulatory Visit: Payer: Medicare HMO | Admitting: Family Medicine

## 2016-05-30 ENCOUNTER — Encounter: Payer: Self-pay | Admitting: Gastroenterology

## 2016-05-31 DIAGNOSIS — R69 Illness, unspecified: Secondary | ICD-10-CM | POA: Diagnosis not present

## 2016-06-11 ENCOUNTER — Encounter: Payer: Self-pay | Admitting: Gastroenterology

## 2016-06-11 ENCOUNTER — Ambulatory Visit (AMBULATORY_SURGERY_CENTER): Payer: Medicare HMO | Admitting: Gastroenterology

## 2016-06-11 ENCOUNTER — Encounter: Payer: Medicare HMO | Admitting: Gastroenterology

## 2016-06-11 VITALS — BP 135/68 | HR 71 | Temp 97.8°F | Resp 16 | Ht 68.0 in | Wt 172.0 lb

## 2016-06-11 DIAGNOSIS — R1013 Epigastric pain: Secondary | ICD-10-CM | POA: Diagnosis not present

## 2016-06-11 DIAGNOSIS — K219 Gastro-esophageal reflux disease without esophagitis: Secondary | ICD-10-CM | POA: Diagnosis not present

## 2016-06-11 DIAGNOSIS — K2970 Gastritis, unspecified, without bleeding: Secondary | ICD-10-CM | POA: Diagnosis not present

## 2016-06-11 DIAGNOSIS — K2990 Gastroduodenitis, unspecified, without bleeding: Secondary | ICD-10-CM

## 2016-06-11 DIAGNOSIS — D509 Iron deficiency anemia, unspecified: Secondary | ICD-10-CM

## 2016-06-11 DIAGNOSIS — K2950 Unspecified chronic gastritis without bleeding: Secondary | ICD-10-CM | POA: Diagnosis not present

## 2016-06-11 DIAGNOSIS — K30 Functional dyspepsia: Secondary | ICD-10-CM | POA: Diagnosis not present

## 2016-06-11 DIAGNOSIS — I10 Essential (primary) hypertension: Secondary | ICD-10-CM | POA: Diagnosis not present

## 2016-06-11 DIAGNOSIS — B9681 Helicobacter pylori [H. pylori] as the cause of diseases classified elsewhere: Secondary | ICD-10-CM | POA: Diagnosis not present

## 2016-06-11 DIAGNOSIS — E119 Type 2 diabetes mellitus without complications: Secondary | ICD-10-CM | POA: Diagnosis not present

## 2016-06-11 MED ORDER — SODIUM CHLORIDE 0.9 % IV SOLN: 500.0000 mL | INTRAVENOUS | Status: AC

## 2016-06-11 NOTE — Op Note (Signed)
[facility] Patient Name: Adrian Wood Procedure Date: 06/11/2016 1:21 PM MRN: [id number] Endoscopist: Milus Banister , MD Age: 83 Referring MD:  Date of Birth: 11-20-1926 Gender: Male Account #: [id number] Procedure:                Upper GI endoscopy Indications:              Dyspepsia Medicines:                Monitored Anesthesia Care Procedure:                Pre-Anesthesia Assessment:                           - Prior to the procedure, a History and Physical                            was performed, and patient medications and                            allergies were reviewed. The patient's tolerance of                            previous anesthesia was also reviewed. The risks                            and benefits of the procedure and the sedation                            options and risks were discussed with the patient.                            All questions were answered, and informed consent                            was obtained. Prior Anticoagulants: The patient has                            taken no previous anticoagulant or antiplatelet                            agents. ASA Grade Assessment: III - A patient with                            severe systemic disease. After reviewing the risks                            and benefits, the patient was deemed in                            satisfactory condition to undergo the procedure.                           After obtaining informed consent, the endoscope was  passed under direct vision. Throughout the                            procedure, the patient's blood pressure, pulse, and                            oxygen saturations were monitored continuously. The                            Model [id number]-[id number]) scope was introduced                            through the mouth, and advanced to the second part                            of duodenum. The upper GI endoscopy was                             accomplished without difficulty. The patient                            tolerated the procedure well. Scope In: Scope Out: Findings:                 The esophagus was normal.                           Mild inflammation characterized by erythema and                            friability was found in the gastric antrum.                            Biopsies were taken with a cold forceps for                            histology (jar 1).                           The proximal stomach was congested, friable and and                            had a somewhat portal gastropathy appearance. This                            was biopsied (jar 2).                           The examined duodenum was normal. Complications:            No immediate complications. Estimated blood loss:                            None. Estimated Blood Loss:     Estimated blood loss: none. Impression:               - Normal  esophagus.                           - Gatritis (proximal and distal). Two sets of                            biopsies taken.                           - Normal examined duodenum. Recommendation:           - Patient has a contact number available for                            emergencies. The signs and symptoms of potential                            delayed complications were discussed with the                            patient. Return to normal activities tomorrow.                            Written discharge instructions were provided to the                            patient.                           - Resume previous diet.                           - Continue present medications.                           - Await pathology results. Milus Banister, MD 06/11/2016 1:39:31 PM This report has been signed electronically.

## 2016-06-11 NOTE — Progress Notes (Signed)
Called to room to assist during endoscopic procedure.  Patient ID and intended procedure confirmed with present staff. Received instructions for my participation in the procedure from the performing physician.  

## 2016-06-11 NOTE — Patient Instructions (Signed)
YOU HAD AN ENDOSCOPIC PROCEDURE TODAY AT [facility]:   Refer to the procedure report that was given to you for any specific questions about what was found during the examination.  If the procedure report does not answer your questions, please call your gastroenterologist to clarify.  If you requested that your care partner not be given the details of your procedure findings, then the procedure report has been included in a sealed envelope for you to review at your convenience later.  YOU SHOULD EXPECT: Some feelings of bloating in the abdomen. Passage of more gas than usual.  Walking can help get rid of the air that was put into your GI tract during the procedure and reduce the bloating.  Please Note:  You might notice some irritation and congestion in your nose or some drainage.  This is from the oxygen used during your procedure.  There is no need for concern and it should clear up in a day or so.  SYMPTOMS TO REPORT IMMEDIATELY:  Following upper endoscopy (EGD)  Vomiting of blood or coffee ground material  New chest pain or pain under the shoulder blades  Painful or persistently difficult swallowing  New shortness of breath  Fever of 100F or higher  Black, tarry-looking stools  For urgent or emergent issues, a gastroenterologist can be reached at any hour by calling [phone].   DIET:  We do recommend a small meal at first, but then you may proceed to your regular diet.  Drink plenty of fluids but you should avoid alcoholic beverages for 24 hours.  ACTIVITY:  You should plan to take it easy for the rest of today and you should NOT DRIVE or use heavy machinery until tomorrow (because of the sedation medicines used during the test).    FOLLOW UP: Our staff will call the number listed on your records the next business day following your procedure to check on you and address any questions or concerns that you may have regarding the information given to you following  your procedure. If we do not reach you, we will leave a message.  However, if you are feeling well and you are not experiencing any problems, there is no need to return our call.  We will assume that you have returned to your regular daily activities without incident.  If any biopsies were taken you will be contacted by phone or by letter within the next 1-3 weeks.  Please call us at [phone] if you have not heard about the biopsies in 3 weeks.   SIGNATURES/CONFIDENTIALITY: You and/or your care partner have signed paperwork which will be entered into your electronic medical record.  These signatures attest to the fact that that the information above on your After Visit Summary has been reviewed and is understood.  Full responsibility of the confidentiality of this discharge information lies with you and/or your care-partner.  Await pathology  Please read over handout about gastritits  Please continue your normal medications

## 2016-06-11 NOTE — Progress Notes (Signed)
Report to PACU, RN, vss, BBS= Clear.  

## 2016-06-12 ENCOUNTER — Telehealth: Payer: Self-pay | Admitting: *Deleted

## 2016-06-12 NOTE — Telephone Encounter (Signed)
  Follow up Call-  Call back number 06/11/2016  Post procedure Call Back phone  # [phone]  Permission to leave phone message Yes  Some recent data might be hidden    [phone]- number called, spoke with wife Patient questions:  Do you have a fever, pain , or abdominal swelling? No. Pain Score  0 *  Have you tolerated food without any problems? Yes.    Have you been able to return to your normal activities? Yes.    Do you have any questions about your discharge instructions: Diet   No. Medications  No. Follow up visit  No.  Do you have questions or concerns about your Care? No.  Actions: * If pain score is 4 or above: No action needed, pain <4.

## 2016-06-20 ENCOUNTER — Telehealth: Payer: Self-pay | Admitting: Gastroenterology

## 2016-06-20 MED ORDER — BIS SUBCIT-METRONID-TETRACYC 140-125-125 MG PO CAPS: 3.0000 | ORAL_CAPSULE | Freq: Three times a day (TID) | ORAL | 0 refills | Status: DC

## 2016-06-20 NOTE — Telephone Encounter (Signed)
Pt has been advised and Pylera has been sent to the pharmacy.  He will call back to schedule f/u.  Allergies and meds reviewed.  He will continue to take protonix twice daily while on Pylera

## 2016-06-23 ENCOUNTER — Telehealth: Payer: Self-pay | Admitting: Gastroenterology

## 2016-06-23 NOTE — Telephone Encounter (Signed)
The pt was notified that the prior auth was sent on Fridaya and we are waiting on the insurance.

## 2016-06-25 NOTE — Telephone Encounter (Signed)
Patient wife states that pylera was denied and she is trying to appeal it. Pt wife states the appeal number is [id number] and to call [phone]. Wife sounds really worried and states husband is going downhill fast.

## 2016-06-25 NOTE — Telephone Encounter (Signed)
The pt's wife was notified that an alternative was sent to the pharmacy.  She will call back with any further issues

## 2016-06-26 ENCOUNTER — Other Ambulatory Visit: Payer: Self-pay

## 2016-06-26 MED ORDER — CLARITHROMYCIN 500 MG PO TABS: 500.0000 mg | ORAL_TABLET | Freq: Two times a day (BID) | ORAL | 0 refills | Status: DC

## 2016-06-26 MED ORDER — AMOXICILLIN 500 MG PO TABS: 1000.0000 mg | ORAL_TABLET | Freq: Two times a day (BID) | ORAL | 0 refills | Status: DC

## 2016-06-26 MED ORDER — OMEPRAZOLE 40 MG PO CPDR: 40.0000 mg | DELAYED_RELEASE_CAPSULE | Freq: Two times a day (BID) | ORAL | 0 refills | Status: DC

## 2016-06-26 NOTE — Telephone Encounter (Signed)
Pylera was covered and $8 the pt has the prescription

## 2016-07-14 ENCOUNTER — Ambulatory Visit (INDEPENDENT_AMBULATORY_CARE_PROVIDER_SITE_OTHER): Payer: Medicare HMO | Admitting: Family Medicine

## 2016-07-14 ENCOUNTER — Encounter: Payer: Self-pay | Admitting: Family Medicine

## 2016-07-14 VITALS — BP 122/78 | HR 88 | Temp 98.3°F | Wt 167.2 lb

## 2016-07-14 DIAGNOSIS — D509 Iron deficiency anemia, unspecified: Secondary | ICD-10-CM | POA: Diagnosis not present

## 2016-07-14 DIAGNOSIS — N401 Enlarged prostate with lower urinary tract symptoms: Secondary | ICD-10-CM | POA: Diagnosis not present

## 2016-07-14 DIAGNOSIS — E1165 Type 2 diabetes mellitus with hyperglycemia: Secondary | ICD-10-CM

## 2016-07-14 DIAGNOSIS — A048 Other specified bacterial intestinal infections: Secondary | ICD-10-CM

## 2016-07-14 DIAGNOSIS — IMO0001 Reserved for inherently not codable concepts without codable children: Secondary | ICD-10-CM

## 2016-07-14 DIAGNOSIS — D508 Other iron deficiency anemias: Secondary | ICD-10-CM

## 2016-07-14 DIAGNOSIS — R3911 Hesitancy of micturition: Secondary | ICD-10-CM | POA: Diagnosis not present

## 2016-07-14 LAB — CBC WITH DIFFERENTIAL/PLATELET
BASOS ABS: 0.1 10*3/uL (ref 0.0–0.1)
Basophils Relative: 1.1 % (ref 0.0–3.0)
EOS PCT: 1.4 % (ref 0.0–5.0)
Eosinophils Absolute: 0.1 10*3/uL (ref 0.0–0.7)
HCT: 31.3 % — ABNORMAL LOW (ref 39.0–52.0)
HEMOGLOBIN: 9.6 g/dL — AB (ref 13.0–17.0)
LYMPHS ABS: 1.9 10*3/uL (ref 0.7–4.0)
LYMPHS PCT: 19.2 % (ref 12.0–46.0)
MCHC: 30.7 g/dL (ref 30.0–36.0)
Monocytes Absolute: 0.9 10*3/uL (ref 0.1–1.0)
Monocytes Relative: 9.4 % (ref 3.0–12.0)
NEUTROS PCT: 68.9 % (ref 43.0–77.0)
Neutro Abs: 6.8 10*3/uL (ref 1.4–7.7)
Platelets: 512 10*3/uL — ABNORMAL HIGH (ref 150.0–400.0)
RBC: 4.48 Mil/uL (ref 4.22–5.81)
RDW: 19.5 % — ABNORMAL HIGH (ref 11.5–15.5)
WBC: 9.8 10*3/uL (ref 4.0–10.5)

## 2016-07-14 LAB — FERRITIN: Ferritin: 8.5 ng/mL — ABNORMAL LOW (ref 22.0–322.0)

## 2016-07-14 LAB — POCT GLYCOSYLATED HEMOGLOBIN (HGB A1C): HEMOGLOBIN A1C: 6.7

## 2016-07-14 MED ORDER — FINASTERIDE 5 MG PO TABS: 5.0000 mg | ORAL_TABLET | Freq: Every day | ORAL | 3 refills | Status: DC

## 2016-07-14 NOTE — Progress Notes (Signed)
Pre visit review using our clinic review tool, if applicable. No additional management support is needed unless otherwise documented below in the visit note. 

## 2016-07-14 NOTE — Progress Notes (Signed)
Subjective:     Patient ID: Adrian Wood, male   DOB: 1926-07-23, 83 y.o.   MRN: [id number]  HPI Patient seen for medical follow-up. He had recent iron deficiency anemia and was sent to GI. He had upper endoscopy which showed some chronic antral gastritis with positive Helicobacter pylori. He was eventually treated with Pylera but had some initial difficulties getting this covered by insurance. He denies any abdominal pain. He feels better overall. Less fatigue. No dizziness. Most recent hemoglobin 10.0. He has frequent burping and still has some occasional dyspepsia.  Other issue is history of BPH. He usually gets up 2-3 times per night and has had some progressive slow stream in recent months. No burning with urination.  Type 2 diabetes. History of good control. No polyuria or polydipsia. Appetite and weight stable  Past Medical History:  Diagnosis Date  . ABNORMAL CHEST XRAY 12/10/2009  . Borderline hypertension   . DEPRESSION 11/09/2008  . DIAB W/O COMP TYPE II/UNS NOT STATED UNCNTRL 11/09/2008  . GERD (gastroesophageal reflux disease)   . History of gastric ulcer   . HYPERLIPIDEMIA 05/24/2010  . Microscopic hematuria 11/16/2009  . Nonspecific (abnormal) findings on radiological and other examination of body structure 12/10/2009   Past Surgical History:  Procedure Laterality Date  . None      reports that he quit smoking about 33 years ago. His smoking use included Cigarettes. He has a 30.00 pack-year smoking history. He has never used smokeless tobacco. He reports that he drinks about 1.2 oz of alcohol per week . He reports that he does not use drugs. family history includes Heart attack in his father; Lung cancer in his sister. Allergies  Allergen Reactions  . Amlodipine Swelling     Review of Systems  Constitutional: Negative for fatigue.  Eyes: Negative for visual disturbance.  Respiratory: Negative for cough, chest tightness and shortness of breath.   Cardiovascular:  Negative for chest pain, palpitations and leg swelling.  Endocrine: Negative for polydipsia and polyuria.  Genitourinary: Positive for difficulty urinating. Negative for hematuria.  Neurological: Negative for dizziness, syncope, weakness, light-headedness and headaches.       Objective:   Physical Exam  Constitutional: He is oriented to person, place, and time. He appears well-developed and well-nourished.  HENT:  Right Ear: External ear normal.  Left Ear: External ear normal.  Mouth/Throat: Oropharynx is clear and moist.  Eyes: Pupils are equal, round, and reactive to light.  Neck: Neck supple. No thyromegaly present.  Cardiovascular: Normal rate and regular rhythm.   Pulmonary/Chest: Effort normal and breath sounds normal. No respiratory distress. He has no wheezes. He has no rales.  Musculoskeletal: He exhibits no edema.  Neurological: He is alert and oriented to person, place, and time.       Assessment:     #1 type 2 diabetes. Hemoglobin A1c today 6.7%  #2 recent positive Helicobacter pylori/antral gastritis  #3 iron deficiency anemia probably related #2  #4 BPH with progressive symptoms gradually over time on Flomax    Plan:     -Repeat CBC and ferritn level -Avoid non-steroidals -Start finasteride 5 mg once daily and continue Flomax -Reassess in 4 months and sooner as needed  Eulas Post MD [facility]

## 2016-08-07 ENCOUNTER — Other Ambulatory Visit: Payer: Self-pay | Admitting: Family Medicine

## 2016-08-14 DIAGNOSIS — R69 Illness, unspecified: Secondary | ICD-10-CM | POA: Diagnosis not present

## 2016-09-16 ENCOUNTER — Other Ambulatory Visit: Payer: Self-pay

## 2016-09-16 ENCOUNTER — Other Ambulatory Visit (INDEPENDENT_AMBULATORY_CARE_PROVIDER_SITE_OTHER): Payer: Medicare HMO

## 2016-09-16 ENCOUNTER — Ambulatory Visit (INDEPENDENT_AMBULATORY_CARE_PROVIDER_SITE_OTHER): Payer: Medicare HMO | Admitting: Gastroenterology

## 2016-09-16 ENCOUNTER — Encounter: Payer: Self-pay | Admitting: Gastroenterology

## 2016-09-16 VITALS — BP 120/62 | HR 84 | Ht 68.5 in | Wt 158.2 lb

## 2016-09-16 DIAGNOSIS — D509 Iron deficiency anemia, unspecified: Secondary | ICD-10-CM

## 2016-09-16 DIAGNOSIS — Z8619 Personal history of other infectious and parasitic diseases: Secondary | ICD-10-CM

## 2016-09-16 DIAGNOSIS — D508 Other iron deficiency anemias: Secondary | ICD-10-CM

## 2016-09-16 LAB — CBC WITH DIFFERENTIAL/PLATELET
BASOS ABS: 0.1 10*3/uL (ref 0.0–0.1)
Basophils Relative: 0.7 % (ref 0.0–3.0)
EOS ABS: 0.1 10*3/uL (ref 0.0–0.7)
Eosinophils Relative: 0.8 % (ref 0.0–5.0)
HCT: 30.2 % — ABNORMAL LOW (ref 39.0–52.0)
HEMOGLOBIN: 9.5 g/dL — AB (ref 13.0–17.0)
LYMPHS ABS: 2 10*3/uL (ref 0.7–4.0)
Lymphocytes Relative: 21.6 % (ref 12.0–46.0)
MCHC: 31.5 g/dL (ref 30.0–36.0)
MCV: 69.7 fl — ABNORMAL LOW (ref 78.0–100.0)
Monocytes Absolute: 0.8 10*3/uL (ref 0.1–1.0)
Monocytes Relative: 8.6 % (ref 3.0–12.0)
NEUTROS PCT: 68.3 % (ref 43.0–77.0)
Neutro Abs: 6.4 10*3/uL (ref 1.4–7.7)
Platelets: 451 10*3/uL — ABNORMAL HIGH (ref 150.0–400.0)
RBC: 4.34 Mil/uL (ref 4.22–5.81)
RDW: 21.6 % — ABNORMAL HIGH (ref 11.5–15.5)
WBC: 9.4 10*3/uL (ref 4.0–10.5)

## 2016-09-16 NOTE — Patient Instructions (Addendum)
Your physician has requested that you go to the basement for the following lab work before leaving today: [facility] has requested that you go to the basement for the following lab work on October 14, 2016: Stool testing for H. Pylori antigen, please stop protonix in 2 weeks ( 09/30/16) to get ready for that test.  Please start daily iron supplement ferrous sulfate 325mg , one pill once daily (over the counter).  If you are age 81 or older, your body mass index should be between 23-30. Your Body mass index is 23.7 kg/m. If this is out of the aforementioned range listed, please consider follow up with your Primary Care Provider.  If you are age 67 or younger, your body mass index should be between 19-25. Your Body mass index is 23.7 kg/m. If this is out of the aformentioned range listed, please consider follow up with your Primary Care Provider.

## 2016-09-16 NOTE — Progress Notes (Signed)
Review of pertinent gastrointestinal problems: 1. IDA and dyspepsia led to EGD 05/2016 Dr. Ardis Hughs, friable gastritis noted, biopsies + for H. Pylori.  Treated with pylera.   HPI: This is a  very pleasant 83 year old man who is here with his wife today. I last saw them both about 3 months ago the time of an upper endoscopy.  Chief complaint is H. pylori infection, dyspepsia  He completed the pyeral treatment.  Still has indigestion after eating, but overall MUCH better.  CBC 06/2016 shows Hb 9.6 (down from 10.0 in January 2018) MCV 69.8 however Ferritin was slightly higher (from 5.8 to 8.5 over same interval.   ROS: complete GI ROS as described in HPI, all other review negative.  Constitutional:  No unintentional weight loss   Past Medical History:  Diagnosis Date  . ABNORMAL CHEST XRAY 12/10/2009  . Borderline hypertension   . DEPRESSION 11/09/2008  . DIAB W/O COMP TYPE II/UNS NOT STATED UNCNTRL 11/09/2008  . GERD (gastroesophageal reflux disease)   . History of gastric ulcer   . HYPERLIPIDEMIA 05/24/2010  . Microscopic hematuria 11/16/2009  . Nonspecific (abnormal) findings on radiological and other examination of body structure 12/10/2009    Past Surgical History:  Procedure Laterality Date  . NO PAST SURGERIES      Current Outpatient Prescriptions  Medication Sig Dispense Refill  . aspirin 81 MG tablet Take 81 mg by mouth every morning.     Marland Kitchen Cod Liver Oil 10 MINIM CAPS Take 1 capsule by mouth every morning.     . cyanocobalamin 500 MCG tablet Take 500 mcg by mouth daily.    . finasteride (PROSCAR) 5 MG tablet Take 1 tablet (5 mg total) by mouth daily. 90 tablet 3  . glimepiride (AMARYL) 4 MG tablet TAKE ONE TABLET BY MOUTH DAILY BEFORE BREAKFAST 90 tablet 2  . glucose blood test strip Check blood sugars twice per day. DX: E11.65 300 each 2  . hydrochlorothiazide (MICROZIDE) 12.5 MG capsule Take 1 capsule (12.5 mg total) by mouth daily. 30 capsule 5  . losartan (COZAAR) 50 MG  tablet TAKE 1 TABLET (50 MG TOTAL) BY MOUTH DAILY. 90 tablet 2  . metFORMIN (GLUCOPHAGE) 500 MG tablet TAKE 2 TABLETS TWICE DAILY WITH MEALS 360 tablet 3  . Multiple Vitamins-Minerals (MULTIVITAMIN WITH MINERALS) tablet Take 1 tablet by mouth daily.      . pantoprazole (PROTONIX) 40 MG tablet TAKE 1 TABLET (40 MG TOTAL) BY MOUTH 2 (TWO) TIMES DAILY. 180 tablet 2  . pravastatin (PRAVACHOL) 20 MG tablet TAKE 1 TABLET BY MOUTH DAILY 90 tablet 3  . tamsulosin (FLOMAX) 0.4 MG CAPS capsule TAKE 1 CAPSULE (0.4 MG TOTAL) BY MOUTH DAILY. 90 capsule 3   Current Facility-Administered Medications  Medication Dose Route Frequency Provider Last Rate Last Dose  . 0.9 %  sodium chloride infusion  500 mL Intravenous Continuous Milus Banister, MD        Allergies as of 09/16/2016 - Review Complete 09/16/2016  Allergen Reaction Noted  . Amlodipine Swelling 04/23/2016    Family History  Problem Relation Age of Onset  . Heart attack Father   . Lung cancer Sister   . Colon cancer Neg Hx   . Stomach cancer Neg Hx   . Esophageal cancer Neg Hx   . Rectal cancer Neg Hx   . Liver cancer Neg Hx     Social History   Social History  . Marital status: Married    Spouse name: Cherylle  .  Number of children: 2  . Years of education: N/A   Occupational History  . retired    Social History Main Topics  . Smoking status: Former Smoker    Packs/day: 1.00    Years: 30.00    Types: Cigarettes    Quit date: 08/21/1982  . Smokeless tobacco: Never Used  . Alcohol use 1.2 oz/week    2 Shots of liquor per week     Comment: once a week  . Drug use: No  . Sexual activity: Not on file   Other Topics Concern  . Not on file   Social History Narrative  . No narrative on file     Physical Exam: BP 120/62   Pulse 84   Ht 5' 8.5" (1.74 m)   Wt 158 lb 3.2 oz (71.8 kg)   BMI 23.70 kg/m  Constitutional: generally well-appearing Psychiatric: alert and oriented x3 Abdomen: soft, nontender, nondistended,  no obvious ascites, no peritoneal signs, normal bowel sounds No peripheral edema noted in lower extremities  Assessment and plan: 83 y.o. male with Previous H. pylori infection, dyspepsia  He is overall much improved from several months ago after the H. pylori treatment but he is still bothered by a bit of postprandial dyspepsia. We are going to arrange for H. pylori stool antigen testing after he holds Protonix for about 2 weeks. He'll get a repeat CBC today. He has not been taking iron supplements as I thought and as his primary care physician thought as well. He is going to start taking iron supplements today, over-the-counter one pill once daily.  Please see the "Patient Instructions" section for addition details about the plan.  Owens Loffler, MD [facility] 09/16/2016, 9:48 AM

## 2016-09-26 DIAGNOSIS — H52203 Unspecified astigmatism, bilateral: Secondary | ICD-10-CM | POA: Diagnosis not present

## 2016-09-26 DIAGNOSIS — E119 Type 2 diabetes mellitus without complications: Secondary | ICD-10-CM | POA: Diagnosis not present

## 2016-09-26 DIAGNOSIS — Z961 Presence of intraocular lens: Secondary | ICD-10-CM | POA: Diagnosis not present

## 2016-09-26 DIAGNOSIS — H353132 Nonexudative age-related macular degeneration, bilateral, intermediate dry stage: Secondary | ICD-10-CM | POA: Diagnosis not present

## 2016-09-26 LAB — HM DIABETES EYE EXAM

## 2016-09-29 ENCOUNTER — Encounter: Payer: Self-pay | Admitting: Family Medicine

## 2016-10-05 ENCOUNTER — Other Ambulatory Visit: Payer: Self-pay | Admitting: Family Medicine

## 2016-10-05 DIAGNOSIS — I10 Essential (primary) hypertension: Secondary | ICD-10-CM

## 2016-10-13 ENCOUNTER — Telehealth: Payer: Self-pay | Admitting: Gastroenterology

## 2016-10-13 ENCOUNTER — Other Ambulatory Visit: Payer: Medicare HMO

## 2016-10-13 NOTE — Telephone Encounter (Signed)
Patient's wife states she dropped of stool sample on 6/14 at our lab, was told to place it in the box. Specimen was labeled properly. I called to lab they do not have any record of receiving this, Adrian Wood looked at 7/21-[birth year]87 check -in log books, no record of receiving specimen. Wife states that husband had a very difficult time in staying off of his PPI for 2 weeks, and would not be able to do this again. Please advise.

## 2016-10-14 NOTE — Telephone Encounter (Signed)
OK.  Not sure what could have happened with the sample. Would be ideal if he could try again.  If he is not able to do that then he should resume his PPI and we are best to just assume that the pylera treatment was indeed effective at eradicating the H. Pylori.  Please remind him that he should be taking iron supplement once daily and he needs repeat cbc in 6-7 weeks.   tahnks

## 2016-10-14 NOTE — Telephone Encounter (Signed)
Pt advised and will come in 6-7 weeks for labs

## 2016-10-14 NOTE — Telephone Encounter (Signed)
Left message on machine to call back  

## 2016-10-15 ENCOUNTER — Ambulatory Visit (INDEPENDENT_AMBULATORY_CARE_PROVIDER_SITE_OTHER): Payer: Medicare HMO | Admitting: Family Medicine

## 2016-10-15 ENCOUNTER — Ambulatory Visit (INDEPENDENT_AMBULATORY_CARE_PROVIDER_SITE_OTHER): Payer: Medicare HMO

## 2016-10-15 ENCOUNTER — Encounter: Payer: Self-pay | Admitting: Family Medicine

## 2016-10-15 VITALS — BP 110/70 | HR 90 | Temp 98.1°F | Wt 153.0 lb

## 2016-10-15 DIAGNOSIS — R0600 Dyspnea, unspecified: Secondary | ICD-10-CM

## 2016-10-15 DIAGNOSIS — D509 Iron deficiency anemia, unspecified: Secondary | ICD-10-CM | POA: Diagnosis not present

## 2016-10-15 DIAGNOSIS — R0602 Shortness of breath: Secondary | ICD-10-CM | POA: Diagnosis not present

## 2016-10-15 DIAGNOSIS — R634 Abnormal weight loss: Secondary | ICD-10-CM

## 2016-10-15 DIAGNOSIS — R5383 Other fatigue: Secondary | ICD-10-CM

## 2016-10-15 DIAGNOSIS — R05 Cough: Secondary | ICD-10-CM | POA: Diagnosis not present

## 2016-10-15 LAB — COMPREHENSIVE METABOLIC PANEL
ALT: 29 U/L (ref 0–53)
AST: 48 U/L — ABNORMAL HIGH (ref 0–37)
Albumin: 3.9 g/dL (ref 3.5–5.2)
Alkaline Phosphatase: 324 U/L — ABNORMAL HIGH (ref 39–117)
BILIRUBIN TOTAL: 0.4 mg/dL (ref 0.2–1.2)
BUN: 24 mg/dL — ABNORMAL HIGH (ref 6–23)
CO2: 26 meq/L (ref 19–32)
Calcium: 10.1 mg/dL (ref 8.4–10.5)
Chloride: 97 mEq/L (ref 96–112)
Creatinine, Ser: 1.38 mg/dL (ref 0.40–1.50)
GFR: 51.67 mL/min — AB (ref >60.00)
GLUCOSE: 161 mg/dL — AB (ref 70–99)
Potassium: 4.9 mEq/L (ref 3.5–5.1)
Sodium: 132 mEq/L — ABNORMAL LOW (ref 135–145)
Total Protein: 7.2 g/dL (ref 6.0–8.3)

## 2016-10-15 LAB — TSH: TSH: 5.43 u[IU]/mL — AB (ref 0.35–4.50)

## 2016-10-15 LAB — HEMOGLOBIN A1C: HEMOGLOBIN A1C: 6.8 % — AB (ref 4.6–6.5)

## 2016-10-15 NOTE — Progress Notes (Signed)
Subjective:     Patient ID: Adrian Wood, male   DOB: 1926/12/11, 83 y.o.   MRN: [id number]  HPI Patient is a very pleasant 83 year old male with history of type 2 diabetes, hypertension, GERD who is seen with symptoms of dyspnea, fatigue, lightheadedness, declining appetite and further weight loss. He was recently diagnosed with iron deficiency anemia. He had EGD which showed some gastritis and positive Helicobacter. He was placed on treatment for H  Pylori and is taking iron replacement once daily. He went back for follow-up after treatment for his H pylori but apparently his stool sample was lost.  Recent hemoglobin around end of May was still 9.5 range. Essentially unchanged from one month prior. He has type 2 diabetes which has generally been well controlled. Last A1c 6.7% 3 months ago.  Denies any headaches or abdominal pain. He's had some dry cough recently. He smoked from age [age]. No hemoptysis.  Past Medical History:  Diagnosis Date  . ABNORMAL CHEST XRAY 12/10/2009  . Borderline hypertension   . DEPRESSION 11/09/2008  . DIAB W/O COMP TYPE II/UNS NOT STATED UNCNTRL 11/09/2008  . GERD (gastroesophageal reflux disease)   . History of gastric ulcer   . HYPERLIPIDEMIA 05/24/2010  . Microscopic hematuria 11/16/2009  . Nonspecific (abnormal) findings on radiological and other examination of body structure 12/10/2009   Past Surgical History:  Procedure Laterality Date  . NO PAST SURGERIES      reports that he quit smoking about 34 years ago. His smoking use included Cigarettes. He has a 30.00 pack-year smoking history. He has never used smokeless tobacco. He reports that he drinks about 1.2 oz of alcohol per week . He reports that he does not use drugs. family history includes Heart attack in his father; Lung cancer in his sister. Allergies  Allergen Reactions  . Amlodipine Swelling     Review of Systems  Constitutional: Positive for fatigue and unexpected weight change. Negative  for chills and fever.  HENT: Negative for trouble swallowing.   Respiratory: Positive for cough and shortness of breath.   Cardiovascular: Negative for chest pain, palpitations and leg swelling.  Gastrointestinal: Negative for abdominal distention.  Neurological: Positive for dizziness. Negative for headaches.  Hematological: Negative for adenopathy.       Objective:   Physical Exam  Constitutional: He appears well-developed.  HENT:  Mouth/Throat: Oropharynx is clear and moist.  Neck: Neck supple.  Cardiovascular: Normal rate and regular rhythm.   Pulmonary/Chest: Effort normal and breath sounds normal. No respiratory distress. He has no wheezes. He has no rales.  Abdominal: Soft. Bowel sounds are normal. He exhibits no distension and no mass. There is no tenderness. There is no rebound and no guarding.  Musculoskeletal: He exhibits no edema.       Assessment:     #1 patient presents with progressive weight loss and decline in appetite and fatigue over several months. Given his age, worrisome for possible malignancy  #2 iron deficiency anemia with recent diagnosis of gastritis and positive Helicobacter pylori from EGD    Plan:     -Repeat comprehensive metabolic panel, TSH, hemoglobin A1c -Set up PA and lateral chest x-ray -Consider CT abdomen and pelvis -consider Glucerna supplement. -already has follow up set up for next month.  Eulas Post MD [facility] at [facility]

## 2016-10-15 NOTE — Patient Instructions (Signed)
Go for CXR at [facility] clinic- may walk in  We will call you with labs Will set up CT of abdomen and pelvis.

## 2016-10-19 DIAGNOSIS — E162 Hypoglycemia, unspecified: Secondary | ICD-10-CM | POA: Diagnosis not present

## 2016-10-19 DIAGNOSIS — E161 Other hypoglycemia: Secondary | ICD-10-CM | POA: Diagnosis not present

## 2016-10-20 ENCOUNTER — Ambulatory Visit (INDEPENDENT_AMBULATORY_CARE_PROVIDER_SITE_OTHER)
Admission: RE | Admit: 2016-10-20 | Discharge: 2016-10-20 | Disposition: A | Discharge status: Home / Self Care | Payer: Medicare HMO | Source: Ambulatory Visit | Attending: Family Medicine | Admitting: Family Medicine

## 2016-10-20 DIAGNOSIS — D509 Iron deficiency anemia, unspecified: Secondary | ICD-10-CM | POA: Diagnosis not present

## 2016-10-20 DIAGNOSIS — R109 Unspecified abdominal pain: Secondary | ICD-10-CM | POA: Diagnosis not present

## 2016-10-20 DIAGNOSIS — R634 Abnormal weight loss: Secondary | ICD-10-CM

## 2016-10-20 MED ORDER — IOPAMIDOL (ISOVUE-300) INJECTION 61%
100.0000 mL | Freq: Once | INTRAVENOUS | Status: AC | PRN
  Administered 2016-10-20: 100 mL via INTRAVENOUS

## 2016-10-21 ENCOUNTER — Ambulatory Visit: Payer: Medicare HMO | Admitting: Family Medicine

## 2016-10-22 ENCOUNTER — Ambulatory Visit (INDEPENDENT_AMBULATORY_CARE_PROVIDER_SITE_OTHER): Payer: Medicare HMO | Admitting: Family Medicine

## 2016-10-22 VITALS — BP 120/76 | HR 108 | Temp 97.9°F | Wt 155.4 lb

## 2016-10-22 DIAGNOSIS — E162 Hypoglycemia, unspecified: Secondary | ICD-10-CM

## 2016-10-22 DIAGNOSIS — R935 Abnormal findings on diagnostic imaging of other abdominal regions, including retroperitoneum: Secondary | ICD-10-CM

## 2016-10-22 DIAGNOSIS — R634 Abnormal weight loss: Secondary | ICD-10-CM

## 2016-10-22 NOTE — Patient Instructions (Signed)
Hypoglycemia  Hypoglycemia occurs when the level of sugar (glucose) in the blood is too low. Glucose is a type of sugar that provides the body's main source of energy. Certain hormones (insulin and glucagon) control the level of glucose in the blood. Insulin lowers blood glucose, and glucagon increases blood glucose. Hypoglycemia can result from having too much insulin in the bloodstream, or from not eating enough food that contains glucose.  Hypoglycemia can happen in people who do or do not have diabetes. It can develop quickly, and it can be a medical emergency.  What are the causes?  Hypoglycemia occurs most often in people who have diabetes. If you have diabetes, hypoglycemia may be caused by:   Diabetes medicine.   Not eating enough, or not eating often enough.   Increased physical activity.   Drinking alcohol, especially when you have not eaten recently.    If you do not have diabetes, hypoglycemia may be caused by:   A tumor in the pancreas. The pancreas is the organ that makes insulin.   Not eating enough, or not eating for long periods at a time (fasting).   Severe infection or illness that affects the liver, heart, or kidneys.   Certain medicines.    You may also have reactive hypoglycemia. This condition causes hypoglycemia within 4 hours of eating a meal. This may occur after having stomach surgery. Sometimes, the cause of reactive hypoglycemia is not known.  What increases the risk?  Hypoglycemia is more likely to develop in:   People who have diabetes and take medicines to lower blood glucose.   People who abuse alcohol.   People who have a severe illness.    What are the signs or symptoms?  Hypoglycemia may not cause any symptoms. If you have symptoms, they may include:   Hunger.   Anxiety.   Sweating and feeling clammy.   Confusion.   Dizziness or feeling light-headed.   Sleepiness.   Nausea.   Increased heart rate.   Headache.   Blurry  vision.   Seizure.   Nightmares.   Tingling or numbness around the mouth, lips, or tongue.   A change in speech.   Decreased ability to concentrate.   A change in coordination.   Restless sleep.   Tremors or shakes.   Fainting.   Irritability.    How is this diagnosed?  Hypoglycemia is diagnosed with a blood test to measure your blood glucose level. This blood test is done while you are having symptoms. Your health care provider may also do a physical exam and review your medical history.  If you do not have diabetes, other tests may be done to find the cause of your hypoglycemia.  How is this treated?  This condition can often be treated by immediately eating or drinking something that contains glucose, such as:   3-4 sugar tablets (glucose pills).   Glucose gel, 15-gram tube.   Fruit juice, 4 oz (120 mL).   Regular soda (not diet soda), 4 oz (120 mL).   Low-fat milk, 4 oz (120 mL).   Several pieces of hard candy.   Sugar or honey, 1 Tbsp.    Treating Hypoglycemia If You Have Diabetes    If you are alert and able to swallow safely, follow the 15:15 rule:   Take 15 grams of a rapid-acting carbohydrate. Rapid-acting options include:  ? 1 tube of glucose gel.  ? 3 glucose pills.  ? 6-8 pieces of hard candy.  ?   4 oz (120 mL) of fruit juice.  ? 4 oz (120 ml) of regular (not diet) soda.   Check your blood glucose 15 minutes after you take the carbohydrate.   If the repeat blood glucose level is still at or below 70 mg/dL (3.9 mmol/L), take 15 grams of a carbohydrate again.   If your blood glucose level does not increase above 70 mg/dL (3.9 mmol/L) after 3 tries, seek emergency medical care.   After your blood glucose level returns to normal, eat a meal or a snack within 1 hour.    Treating Severe Hypoglycemia  Severe hypoglycemia is when your blood glucose level is at or below 54 mg/dL (3 mmol/L). Severe hypoglycemia is an emergency. Do not wait to see if the symptoms will go away. Get medical help  right away. Call your local emergency services (911 in the [place] Do not drive yourself to the hospital.  If you have severe hypoglycemia and you cannot eat or drink, you may need an injection of glucagon. A family member or close friend should learn how to check your blood glucose and how to give you a glucagon injection. Ask your health care provider if you need to have an emergency glucagon injection kit available.  Severe hypoglycemia may need to be treated in a hospital. The treatment may include getting glucose through an IV tube. You may also need treatment for the cause of your hypoglycemia.  Follow these instructions at home:  General instructions   Avoid any diets that cause you to not eat enough food. Talk with your health care provider before you start any new diet.   Take over-the-counter and prescription medicines only as told by your health care provider.   Limit alcohol intake to no more than 1 drink per day for nonpregnant women and 2 drinks per day for men. One drink equals 12 oz of beer, 5 oz of wine, or 1 oz of hard liquor.   Keep all follow-up visits as told by your health care provider. This is important.  If You Have Diabetes:     Make sure you know the symptoms of hypoglycemia.   Always have a rapid-acting carbohydrate snack with you to treat low blood sugar.   Follow your diabetes management plan, as told by your health care provider. Make sure you:  ? Take your medicines as directed.  ? Follow your exercise plan.  ? Follow your meal plan. Eat on time, and do not skip meals.  ? Check your blood glucose as often as directed. Make sure to check your blood glucose before and after exercise. If you exercise longer or in a different way than usual, check your blood glucose more often.  ? Follow your sick day plan whenever you cannot eat or drink normally. Make this plan in advance with your health care provider.   Share your diabetes management plan with people in your workplace, school,  and household.   Check your urine for ketones when you are ill and as told by your health care provider.   Carry a medical alert card or wear medical alert jewelry.  If You Have Reactive Hypoglycemia or Low Blood Sugar From Other Causes:   Monitor your blood glucose as told by your health care provider.   Follow instructions from your health care provider about eating or drinking restrictions.  Contact a health care provider if:   You have problems keeping your blood glucose in your target range.   You have   provider. Make sure you discuss any questions you have with your health care provider. Document Released: 04/14/2005 Document Revised: 09/26/2015 Document Reviewed: 05/18/2015 Elsevier Interactive Patient Education  2018 [employer].  We will set up referral to oncology.

## 2016-10-22 NOTE — Progress Notes (Signed)
Subjective:     Patient ID: Adrian Wood, male   DOB: 1926/09/21, 83 y.o.   MRN: [id number]  HPI  Patient here to discuss recent CT abdomen and pelvis results. This showed diffuse hepatic metastases as well as enlarged abdominal and retroperitoneal lymph nodes and concern for possible sclerotic bony metastases L3 and S1. No definite primary malignancy identified though patient noted to have markedly enlarged prostate gland.  History is that patient has had some decline in recent months with decreased appetite and documented weight loss. He had iron deficiency anemia and was referred to GI and upper endoscopy revealed no clear source. Was H pylori positive and treated for that.  He did not have colonoscopy. We obtained recent lab work concerning for elevated alkaline phosphatase and also mildly elevated liver transaminase AST level.    He does have type 2 diabetes but this has been relatively stable. He had what sounds like probable hypoglycemic episode Saturday. His wife noted he was confused and agitated. EMS was called and he reportedly had low blood sugar which she thinks was around 54. He improved promptly with glucose. No episodes since then. His blood sugars been significant up and down since then. He has resumed metformin after holding this for 48 hours following his recent CT abdomen and pelvis.  Patient denies any pain at this time. He had some slow stream for several years. Has not had any recent PSA or further workup for prostate because of his age.  Past Medical History:  Diagnosis Date  . ABNORMAL CHEST XRAY 12/10/2009  . Borderline hypertension   . DEPRESSION 11/09/2008  . DIAB W/O COMP TYPE II/UNS NOT STATED UNCNTRL 11/09/2008  . GERD (gastroesophageal reflux disease)   . History of gastric ulcer   . HYPERLIPIDEMIA 05/24/2010  . Microscopic hematuria 11/16/2009  . Nonspecific (abnormal) findings on radiological and other examination of body structure 12/10/2009   Past Surgical  History:  Procedure Laterality Date  . NO PAST SURGERIES      reports that he quit smoking about 34 years ago. His smoking use included Cigarettes. He has a 30.00 pack-year smoking history. He has never used smokeless tobacco. He reports that he drinks about 1.2 oz of alcohol per week . He reports that he does not use drugs. family history includes Heart attack in his father; Lung cancer in his sister. Allergies  Allergen Reactions  . Amlodipine Swelling    Review of Systems  Constitutional: Positive for appetite change, fatigue and unexpected weight change. Negative for chills and fever.  Respiratory: Negative for cough.   Cardiovascular: Negative for chest pain.  Gastrointestinal: Negative for abdominal pain, blood in stool, diarrhea, nausea and vomiting.  Genitourinary: Negative for dysuria.  Musculoskeletal: Negative for back pain.  Neurological: Negative for headaches.       Objective:   Physical Exam  Constitutional: He is oriented to person, place, and time. He appears well-developed and well-nourished.  Neck: Neck supple. No thyromegaly present.  Cardiovascular: Normal rate and regular rhythm.   Pulmonary/Chest: Effort normal and breath sounds normal.  Abdominal: Soft. There is no tenderness. There is no rebound and no guarding.  Musculoskeletal: He exhibits no edema.  Neurological: He is alert and oriented to person, place, and time. No cranial nerve deficit.       Assessment:     Patient presented recently with increased fatigue along with reduced appetite and document weight loss. Further evaluation revealed elevated liver transaminase and alkaline phosphatase.  Concern for possible malignancy  and CT abdomen and pelvis revealed --Probable diffuse hepatic metastases -Enlarged abdominal and retroperitoneal lymph nodes -Concern for sclerotic bony metastases L3 and S1 -Enlarged prostate gland. No definitive primary noted    Plan:     -Discussed findings today at  length with patient and his wife. -Continue to hold HCTZ -Monitor blood sugars closely -May need to look at alternative medication for controlling his diabetes if he continues to have hypoglycemia (ie may need to stop Amaryl). -Discussed with oncology. He is not sure at this point he wishes to go through tissue biopsy to define possible primary but he has not ruled this out.  Adrian Post MD [facility] at [facility]

## 2016-10-24 ENCOUNTER — Inpatient Hospital Stay: Admission: RE | Admit: 2016-10-24 | Payer: Medicare HMO | Source: Ambulatory Visit

## 2016-10-27 NOTE — Addendum Note (Signed)
Addended by: Eulas Post on: 10/27/2016 05:53 PM   Modules accepted: Orders

## 2016-10-28 ENCOUNTER — Other Ambulatory Visit (INDEPENDENT_AMBULATORY_CARE_PROVIDER_SITE_OTHER): Payer: Medicare HMO

## 2016-10-28 ENCOUNTER — Other Ambulatory Visit: Payer: Self-pay | Admitting: Family Medicine

## 2016-10-28 DIAGNOSIS — R634 Abnormal weight loss: Secondary | ICD-10-CM | POA: Diagnosis not present

## 2016-10-28 LAB — PSA: PSA: 85.59 ng/mL — ABNORMAL HIGH (ref 0.10–4.00)

## 2016-10-30 ENCOUNTER — Other Ambulatory Visit: Payer: Self-pay | Admitting: Family Medicine

## 2016-10-30 DIAGNOSIS — R972 Elevated prostate specific antigen [PSA]: Secondary | ICD-10-CM

## 2016-11-01 DIAGNOSIS — R69 Illness, unspecified: Secondary | ICD-10-CM | POA: Diagnosis not present

## 2016-11-06 ENCOUNTER — Telehealth: Payer: Self-pay | Admitting: Family Medicine

## 2016-11-06 NOTE — Telephone Encounter (Signed)
Pt wife is calling to let md know dr Jeffie Pollock can not see her husband until 11-24-16. Per pt wife her husband is on cancellation list. Pt wife does not want her husband to wait that long due to urinating issues. Please call pt wife back.

## 2016-11-07 NOTE — Telephone Encounter (Signed)
I'm not sure we can get him in any sooner.  Only other option would be to look outside of [facility]- eg  [place] or [place]- but can't guarantee they could see any sooner.

## 2016-11-07 NOTE — Telephone Encounter (Signed)
I called the pt and informed his wife of the message below and she stated she will continue calling [facility] to check for cancellations.

## 2016-11-11 ENCOUNTER — Telehealth: Payer: Self-pay | Admitting: Family Medicine

## 2016-11-11 NOTE — Telephone Encounter (Signed)
° °  Pt wife call to say pt BP is running high. Pt said Dr Elease Hashimoto  Took her husband off  Hydrochlorothiazide.but looks like he should be taking   [place]  Pt would like a call back     [phone]

## 2016-11-11 NOTE — Telephone Encounter (Signed)
I would go ahead and start back the HCTZ

## 2016-11-11 NOTE — Telephone Encounter (Signed)
Spoke with pt's wife and advised. She will monitor BP readings and call office if they remain high.

## 2016-11-11 NOTE — Telephone Encounter (Signed)
Spoke with pt's wife. She states that since stopping the HCTZ pt's BP has continued to rise. Reading are ranging from 153/80, 163/96, 157/87, 165/89...Marland Kitchen They would like to know if he needs to restart medication.  Dr. Elease Hashimoto - Please advise. Thanks!

## 2016-11-14 ENCOUNTER — Ambulatory Visit: Payer: Medicare HMO | Admitting: Family Medicine

## 2016-11-19 ENCOUNTER — Telehealth: Payer: Self-pay

## 2016-11-19 NOTE — Telephone Encounter (Signed)
Patient's wife called regarding pt and c/o cough with some mucus production. She would like to know what he can take that is OTC. Advised that pt can take Coricidin HBP. Also advised that if pt develops fever, ShOB or wheeze to contact office. She voiced understanding. Nothing further needed at this time.

## 2016-11-24 ENCOUNTER — Other Ambulatory Visit: Payer: Self-pay | Admitting: Urology

## 2016-11-24 ENCOUNTER — Encounter (HOSPITAL_COMMUNITY): Payer: Self-pay | Admitting: *Deleted

## 2016-11-24 DIAGNOSIS — C7951 Secondary malignant neoplasm of bone: Secondary | ICD-10-CM | POA: Diagnosis not present

## 2016-11-24 DIAGNOSIS — C778 Secondary and unspecified malignant neoplasm of lymph nodes of multiple regions: Secondary | ICD-10-CM | POA: Diagnosis not present

## 2016-11-24 DIAGNOSIS — C61 Malignant neoplasm of prostate: Secondary | ICD-10-CM

## 2016-11-24 NOTE — H&P (Signed)
CC/HPI: CC: fatigue.   Hx: Adrian Wood is a 83 yo WM who was having progressive fatigue and dizziness and during his evaluation he had a CT that showed an enlarged prostate with findings consistent bone, nodal and diffuse hepatic metastatic disease. His PSA was 9.93 in 2012 and is now 4. He has been having trouble with a reduced stream and frequent nocturia. He has some pain with voidint. He has had no hematuria or dysuria. He lost 20 lb over the last 2 months. He has had no prior GU history. He has been on finasteride and tamsulosin for the last 6 months.     ALLERGIES: None   MEDICATIONS: Finasteride 5 mg tablet  Metformin Hcl 500 mg tablet  Tamsulosin Hcl 0.4 mg capsule, ext release 24 hr  Aspirin Ec 81 mg tablet, delayed release  Cod Liver Oil  Cyanocobalamin  Glimepiride 4 mg tablet  Iron  Losartan Potassium 50 mg tablet  Multivitamins With Minerals  Pantoprazole Sodium 40 mg tablet, delayed release  Pravastatin Sodium 20 mg tablet     GU PSH: None   NON-GU PSH: None   GU PMH: None     PMH Notes: stomach ulcers     NON-GU PMH: Anxiety Arthritis Diabetes Type 2 GERD Hypercholesterolemia Hypertension Sleep Apnea    FAMILY HISTORY: None   SOCIAL HISTORY: Marital Status: Married Preferred Language: English; Race: White Current Smoking Status: Patient does not smoke anymore.   Tobacco Use Assessment Completed: Used Tobacco in last 30 days? Does not drink caffeine.     Notes: 1 son, 2 daughters    REVIEW OF SYSTEMS:    GU Review Male:   Patient reports frequent urination, hard to postpone urination, get up at night to urinate, and stream starts and stops. Patient denies burning/ pain with urination, leakage of urine, trouble starting your stream, have to strain to urinate , erection problems, and penile pain.  Gastrointestinal (Upper):   Patient denies nausea, vomiting, and indigestion/ heartburn.  Gastrointestinal (Lower):   Patient denies diarrhea and  constipation.  Constitutional:   Patient reports night sweats and fatigue. Patient denies fever and weight loss.  Skin:   Patient denies skin rash/ lesion and itching.  Eyes:   Patient denies blurred vision and double vision.  Ears/ Nose/ Throat:   Patient denies sore throat and sinus problems.  Hematologic/Lymphatic:   Patient denies swollen glands and easy bruising.  Cardiovascular:   Patient denies leg swelling and chest pains.  Respiratory:   Patient reports cough and shortness of breath.   Endocrine:   Patient denies excessive thirst.  Musculoskeletal:   Patient reports joint pain. Patient denies back pain.  Neurological:   Patient reports dizziness. Patient denies headaches.  Psychologic:   Patient reports anxiety. Patient denies depression.   VITAL SIGNS:      11/24/2016 08:34 AM  Weight 154 lb / 69.85 kg  Height 68.5 in / 173.99 cm  BP 113/74 mmHg  Pulse 120 /min  Resp. Rate 16 /min  BMI 23.1 kg/m   GU PHYSICAL EXAMINATION:    Anus and Perineum: No hemorrhoids. No anal stenosis. No rectal fissure, no anal fissure. No edema, no dimple, no perineal tenderness, no anal tenderness.  Scrotum: No lesions. No edema. No cysts. No warts.  Epididymides: Right: no spermatocele, no masses, no cysts, no tenderness, no induration, no enlargement. Left: no spermatocele, no masses, no cysts, no tenderness, no induration, no enlargement.  Testes: No tenderness, no swelling, no enlargement left testes.  No tenderness, no swelling, no enlargement right testes. Normal location left testes. Normal location right testes. No mass, no cyst, no varicocele, no hydrocele left testes. No mass, no cyst, no varicocele, no hydrocele right testes.  Urethral Meatus: Normal size. No lesion, no wart, no discharge, no polyp. Normal location.  Penis: Circumcised, no warts, no cracks. No dorsal Peyronie's plaques, no left corporal Peyronie's plaques, no right corporal Peyronie's plaques, no scarring, no warts. No  balanitis, no meatal stenosis.  Prostate: Prostate 3 + size. with diffuse nodularity and fixation.   Seminal Vesicles: Nonpalpable.  Sphincter Tone: Normal sphincter. No rectal tenderness. No rectal mass.    MULTI-SYSTEM PHYSICAL EXAMINATION:    Constitutional: Thin. No physical deformities. Normally developed. Good grooming.   Neck: Neck symmetrical, not swollen. Normal tracheal position.  Respiratory: No labored breathing, no use of accessory muscles. CTA  Cardiovascular: Normal temperature,RRR without murmur  Lymphatic: No enlargement of neck, axillae, groin.  Skin: No paleness, no jaundice, no cyanosis. No lesion, no ulcer, no rash.  Neurologic / Psychiatric: Oriented to time, oriented to place, oriented to person. No depression, no anxiety, no agitation.  Gastrointestinal: No hernia. No mass, no tenderness, no rigidity, non obese abdomen.   Musculoskeletal: Normal gait and station of head and neck.     PAST DATA REVIEWED:  Source Of History:  Patient  Lab Test Review:   PSA  X-Ray Review: C.T. Abdomen/Pelvis: Reviewed Films. Reviewed Report. Discussed With Patient.    Notes:                     Alk Phos is 324. Cr. is 1.38.   PROCEDURES:          Urinalysis w/Scope Dipstick Dipstick Cont'd Micro  Color: Orange Bilirubin: 1+ WBC/hpf: NS (Not Seen)  Appearance: Cloudy Ketones: Neg RBC/hpf: NS (Not Seen)  Specific Gravity: 1.025 Blood: Neg Bacteria: Rare (0-9/hpf)  pH: 6.0 Protein: Neg Cystals: NS (Not Seen)  Glucose: Neg Urobilinogen: 1.0 Casts: Hyaline    Nitrites: Neg Trichomonas: Not Present    Leukocyte Esterase: Neg Mucous: Present      Epithelial Cells: NS (Not Seen)      Yeast: NS (Not Seen)      Sperm: Not Present    Notes: A FALSE POSITIVE BILIRUBIN MAY BE CAUSED BY THE DRUG LODINE VERIFIED BY REPEAT ANALYSIS    ASSESSMENT:      ICD-10 Details  1 GU:   Prostate Cancer - C61 He appears to have widely metastatic prostate cancer with a PSA of 85 and a nodular  prostate with obstruction. I reviewed the options and will gt him set up for a prostate [place] and biopsy with frozen section with bilateral orchiectomy if PCa confirmed. I have reviewed the risks of the procedure including bleeding, infection, voiding difficulty and all of the potential side effects of ADT. He will probably need to be considered for either Zytiga or Chemotherapy in addition but based on his age and overall health, the Fabio Asa will probably be most appropriate.   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 Carolann Littler.

## 2016-11-24 NOTE — Progress Notes (Signed)
requested orders with pam gibson at [facility]

## 2016-11-25 ENCOUNTER — Ambulatory Visit (HOSPITAL_COMMUNITY): Payer: Medicare HMO | Admitting: Anesthesiology

## 2016-11-25 ENCOUNTER — Encounter (HOSPITAL_COMMUNITY)
Admission: RE | Disposition: A | Discharge status: Home / Self Care | Payer: Self-pay | Source: Ambulatory Visit | Attending: Urology

## 2016-11-25 ENCOUNTER — Ambulatory Visit (HOSPITAL_COMMUNITY)
Admission: RE | Admit: 2016-11-25 | Discharge: 2016-11-25 | Disposition: A | Discharge status: Home / Self Care | Payer: Medicare HMO | Source: Ambulatory Visit | Attending: Urology | Admitting: Urology

## 2016-11-25 ENCOUNTER — Encounter (HOSPITAL_COMMUNITY): Payer: Self-pay | Admitting: *Deleted

## 2016-11-25 ENCOUNTER — Other Ambulatory Visit: Payer: Self-pay | Admitting: Urology

## 2016-11-25 DIAGNOSIS — F329 Major depressive disorder, single episode, unspecified: Secondary | ICD-10-CM | POA: Insufficient documentation

## 2016-11-25 DIAGNOSIS — C7951 Secondary malignant neoplasm of bone: Secondary | ICD-10-CM | POA: Diagnosis not present

## 2016-11-25 DIAGNOSIS — Z87891 Personal history of nicotine dependence: Secondary | ICD-10-CM | POA: Diagnosis not present

## 2016-11-25 DIAGNOSIS — E119 Type 2 diabetes mellitus without complications: Secondary | ICD-10-CM | POA: Diagnosis not present

## 2016-11-25 DIAGNOSIS — Z7982 Long term (current) use of aspirin: Secondary | ICD-10-CM | POA: Diagnosis not present

## 2016-11-25 DIAGNOSIS — I10 Essential (primary) hypertension: Secondary | ICD-10-CM | POA: Insufficient documentation

## 2016-11-25 DIAGNOSIS — N402 Nodular prostate without lower urinary tract symptoms: Secondary | ICD-10-CM | POA: Insufficient documentation

## 2016-11-25 DIAGNOSIS — K219 Gastro-esophageal reflux disease without esophagitis: Secondary | ICD-10-CM | POA: Diagnosis not present

## 2016-11-25 DIAGNOSIS — C61 Malignant neoplasm of prostate: Secondary | ICD-10-CM | POA: Insufficient documentation

## 2016-11-25 DIAGNOSIS — R972 Elevated prostate specific antigen [PSA]: Secondary | ICD-10-CM | POA: Diagnosis not present

## 2016-11-25 DIAGNOSIS — C787 Secondary malignant neoplasm of liver and intrahepatic bile duct: Secondary | ICD-10-CM | POA: Insufficient documentation

## 2016-11-25 DIAGNOSIS — E785 Hyperlipidemia, unspecified: Secondary | ICD-10-CM | POA: Diagnosis not present

## 2016-11-25 DIAGNOSIS — Z79899 Other long term (current) drug therapy: Secondary | ICD-10-CM | POA: Insufficient documentation

## 2016-11-25 DIAGNOSIS — E1169 Type 2 diabetes mellitus with other specified complication: Secondary | ICD-10-CM | POA: Diagnosis not present

## 2016-11-25 DIAGNOSIS — C7982 Secondary malignant neoplasm of genital organs: Secondary | ICD-10-CM | POA: Diagnosis not present

## 2016-11-25 DIAGNOSIS — N5089 Other specified disorders of the male genital organs: Secondary | ICD-10-CM | POA: Diagnosis not present

## 2016-11-25 DIAGNOSIS — R69 Illness, unspecified: Secondary | ICD-10-CM | POA: Diagnosis not present

## 2016-11-25 HISTORY — DX: Essential (primary) hypertension: I10

## 2016-11-25 HISTORY — PX: PROSTATE BIOPSY: SHX241

## 2016-11-25 HISTORY — PX: ORCHIECTOMY: SHX2116

## 2016-11-25 LAB — CBC
HEMATOCRIT: 38.1 % — AB (ref 39.0–52.0)
HEMOGLOBIN: 13.2 g/dL (ref 13.0–17.0)
MCH: 28 pg (ref 26.0–34.0)
MCHC: 34.6 g/dL (ref 30.0–36.0)
MCV: 80.7 fL (ref 78.0–100.0)
PLATELETS: 468 10*3/uL — AB (ref 150–400)
RBC: 4.72 MIL/uL (ref 4.22–5.81)
RDW: 22.6 % — ABNORMAL HIGH (ref 11.5–15.5)
WBC: 14.2 10*3/uL — AB (ref 4.0–10.5)

## 2016-11-25 LAB — GLUCOSE, CAPILLARY: Glucose-Capillary: 74 mg/dL (ref 65–99)

## 2016-11-25 SURGERY — BIOPSY, PROSTATE, RECTAL APPROACH, WITH US GUIDANCE
Anesthesia: General | Site: Scrotum

## 2016-11-25 MED ORDER — FENTANYL CITRATE (PF) 100 MCG/2ML IJ SOLN
INTRAMUSCULAR | Status: DC | PRN
  Administered 2016-11-25 (×2): 50 ug via INTRAVENOUS

## 2016-11-25 MED ORDER — DEXTROSE 50 % IV SOLN
1.0000 | Freq: Once | INTRAVENOUS | Status: AC
  Administered 2016-11-25: 50 mL via INTRAVENOUS

## 2016-11-25 MED ORDER — FENTANYL CITRATE (PF) 100 MCG/2ML IJ SOLN
INTRAMUSCULAR | Status: AC
  Filled 2016-11-25: qty 2

## 2016-11-25 MED ORDER — DEXTROSE 5 % IV SOLN
5.0000 mg/kg | INTRAVENOUS | Status: AC
  Administered 2016-11-25: 352.4 mg via INTRAVENOUS
  Filled 2016-11-25: qty 8.75

## 2016-11-25 MED ORDER — BUPIVACAINE HCL (PF) 0.25 % IJ SOLN
INTRAMUSCULAR | Status: DC | PRN
  Administered 2016-11-25: 8 mL

## 2016-11-25 MED ORDER — ONDANSETRON HCL 4 MG/2ML IJ SOLN
INTRAMUSCULAR | Status: DC | PRN
  Administered 2016-11-25: 4 mg via INTRAVENOUS

## 2016-11-25 MED ORDER — HYDROMORPHONE HCL-NACL 0.5-0.9 MG/ML-% IV SOSY: 0.2500 mg | PREFILLED_SYRINGE | INTRAVENOUS | Status: DC | PRN

## 2016-11-25 MED ORDER — HYDROCODONE-ACETAMINOPHEN 5-325 MG PO TABS: 1.0000 | ORAL_TABLET | ORAL | 0 refills | Status: DC | PRN

## 2016-11-25 MED ORDER — BUPIVACAINE HCL 0.25 % IJ SOLN
INTRAMUSCULAR | Status: AC
  Filled 2016-11-25: qty 1

## 2016-11-25 MED ORDER — LACTATED RINGERS IV SOLN
INTRAVENOUS | Status: DC
  Administered 2016-11-25 (×3): via INTRAVENOUS

## 2016-11-25 MED ORDER — 0.9 % SODIUM CHLORIDE (POUR BTL) OPTIME
TOPICAL | Status: DC | PRN
  Administered 2016-11-25: 1000 mL

## 2016-11-25 MED ORDER — DEXTROSE 5 % IV SOLN
2.0000 g | INTRAVENOUS | Status: AC
  Administered 2016-11-25: 2 g via INTRAVENOUS
  Filled 2016-11-25: qty 2

## 2016-11-25 MED ORDER — LIDOCAINE HCL (CARDIAC) 20 MG/ML IV SOLN
INTRAVENOUS | Status: DC | PRN
Start: 2016-11-25 — End: 2016-11-25
  Administered 2016-11-25: 60 mg via INTRATRACHEAL

## 2016-11-25 MED ORDER — LIDOCAINE 2% (20 MG/ML) 5 ML SYRINGE
INTRAMUSCULAR | Status: AC
  Filled 2016-11-25: qty 5

## 2016-11-25 MED ORDER — PROPOFOL 10 MG/ML IV BOLUS
INTRAVENOUS | Status: DC | PRN
  Administered 2016-11-25: 150 mg via INTRAVENOUS

## 2016-11-25 MED ORDER — PROMETHAZINE HCL 25 MG/ML IJ SOLN: 6.2500 mg | INTRAMUSCULAR | Status: DC | PRN

## 2016-11-25 MED ORDER — FLEET ENEMA 7-19 GM/118ML RE ENEM: 1.0000 | ENEMA | Freq: Once | RECTAL | Status: DC

## 2016-11-25 MED ORDER — DEXTROSE 50 % IV SOLN
INTRAVENOUS | Status: AC
  Filled 2016-11-25: qty 50

## 2016-11-25 MED ORDER — PHENYLEPHRINE 40 MCG/ML (10ML) SYRINGE FOR IV PUSH (FOR BLOOD PRESSURE SUPPORT)
PREFILLED_SYRINGE | INTRAVENOUS | Status: AC
  Filled 2016-11-25: qty 10

## 2016-11-25 MED ORDER — PROPOFOL 10 MG/ML IV BOLUS
INTRAVENOUS | Status: AC
  Filled 2016-11-25: qty 20

## 2016-11-25 MED ORDER — PHENYLEPHRINE HCL 10 MG/ML IJ SOLN
INTRAMUSCULAR | Status: DC | PRN
  Administered 2016-11-25 (×3): 80 ug via INTRAVENOUS

## 2016-11-25 SURGICAL SUPPLY — 27 items
BENZOIN TINCTURE PRP APPL 2/3 (GAUZE/BANDAGES/DRESSINGS) ×3 IMPLANT
BLADE HEX COATED 2.75 (ELECTRODE) ×3 IMPLANT
BLADE SURG 15 STRL LF DISP TIS (BLADE) ×2 IMPLANT
BLADE SURG 15 STRL SS (BLADE) ×1
BNDG GAUZE ELAST 4 BULKY (GAUZE/BANDAGES/DRESSINGS) ×3 IMPLANT
CATH URET 5FR 28IN OPEN ENDED (CATHETERS) IMPLANT
COVER SURGICAL LIGHT HANDLE (MISCELLANEOUS) ×6 IMPLANT
DRAIN PENROSE 18X1/2 LTX STRL (DRAIN) ×3 IMPLANT
DRAIN PENROSE 18X1/4 LTX STRL (WOUND CARE) ×3 IMPLANT
DRAPE LAPAROTOMY T 98X78 PEDS (DRAPES) ×3 IMPLANT
ELECT PENCIL ROCKER SW 15FT (MISCELLANEOUS) ×3 IMPLANT
ELECT REM PT RETURN 15FT ADLT (MISCELLANEOUS) ×3 IMPLANT
GAUZE SPONGE 4X4 12PLY STRL (GAUZE/BANDAGES/DRESSINGS) ×3 IMPLANT
GLOVE SURG SS PI 8.0 STRL IVOR (GLOVE) ×3 IMPLANT
GOWN STRL REUS W/TWL XL LVL3 (GOWN DISPOSABLE) ×6 IMPLANT
INST BIOPSY MAXCORE 18GX25 (NEEDLE) ×3 IMPLANT
KIT BASIN OR (CUSTOM PROCEDURE TRAY) ×3 IMPLANT
NEEDLE HYPO 22GX1.5 SAFETY (NEEDLE) ×3 IMPLANT
PACK BASIC VI WITH GOWN DISP (CUSTOM PROCEDURE TRAY) ×3 IMPLANT
SPONGE LAP 4X18 X RAY DECT (DISPOSABLE) ×6 IMPLANT
SUPPORT SCROTAL LG STRP (MISCELLANEOUS) ×3 IMPLANT
SUT CHROMIC 3 0 SH 27 (SUTURE) ×9 IMPLANT
SUT CHROMIC 4 0 SH 27 (SUTURE) ×3 IMPLANT
SUT VIC AB 3-0 SH 27 (SUTURE) ×1
SUT VIC AB 3-0 SH 27XBRD (SUTURE) ×2 IMPLANT
SUT VICRYL 0 TIES 12 18 (SUTURE) ×6 IMPLANT
SYR CONTROL 10ML LL (SYRINGE) ×3 IMPLANT

## 2016-11-25 NOTE — Anesthesia Procedure Notes (Signed)
Procedure Name: LMA Insertion Date/Time: 11/25/2016 3:52 PM Performed by: [place] Labo C Pre-anesthesia Checklist: Patient identified, Emergency Drugs available, Suction available and Patient being monitored Patient Re-evaluated:Patient Re-evaluated prior to induction Oxygen Delivery Method: Circle system utilized Preoxygenation: Pre-oxygenation with 100% oxygen Induction Type: IV induction Ventilation: Mask ventilation without difficulty LMA: LMA inserted LMA Size: 4.0 Number of attempts: 1 Airway Equipment and Method: Bite block Placement Confirmation: positive ETCO2 Tube secured with: Tape Dental Injury: Teeth and Oropharynx as per pre-operative assessment

## 2016-11-25 NOTE — Transfer of Care (Signed)
Immediate Anesthesia Transfer of Care Note  Patient: Adrian Wood  Procedure(s) Performed: Procedure(s): BIOPSY TRANSRECTAL ULTRASONIC PROSTATE (TUBP) WITH FROZEN SECTION  (N/A) BILATERAL SCROTAL ORCHIECTOMY (Bilateral)  Patient Location: PACU  Anesthesia Type:General  Level of Consciousness: awake, alert  and oriented  Airway & Oxygen Therapy: Patient Spontanous Breathing and Patient connected to face mask oxygen  Post-op Assessment: Report given to RN and Post -op Vital signs reviewed and stable  Post vital signs: Reviewed and stable  Last Vitals:  Vitals:   11/25/16 1238  BP: 139/74  Pulse: (!) 114  Resp: 20  Temp: 36.5 C    Last Pain:  Vitals:   11/25/16 1238  TempSrc: Oral      Patients Stated Pain Goal: 4 (72/90/21 1155)  Complications: No apparent anesthesia complications

## 2016-11-25 NOTE — Anesthesia Postprocedure Evaluation (Signed)
Anesthesia Post Note  Patient: Adrian Wood  Procedure(s) Performed: Procedure(s) (LRB): BIOPSY TRANSRECTAL ULTRASONIC PROSTATE (TUBP) WITH FROZEN SECTION  (N/A) BILATERAL SCROTAL ORCHIECTOMY (Bilateral)     Patient location during evaluation: PACU Anesthesia Type: General Level of consciousness: awake and alert Pain management: pain level controlled Vital Signs Assessment: post-procedure vital signs reviewed and stable Respiratory status: spontaneous breathing, nonlabored ventilation, respiratory function stable and patient connected to nasal cannula oxygen Cardiovascular status: blood pressure returned to baseline and stable Postop Assessment: no signs of nausea or vomiting Anesthetic complications: no    Last Vitals:  Vitals:   11/25/16 1815 11/25/16 1925  BP: (!) 145/53 115/88  Pulse: 79 98  Resp: 17 16  Temp:  36.6 C    Last Pain:  Vitals:   11/25/16 1925  TempSrc:   PainSc: 0-No pain                 Tiajuana Amass

## 2016-11-25 NOTE — Op Note (Signed)
Operative Note   Preoperative Diagnosis: Elevated PSA with diffuse metastatic disease   Postoperative Diagnosis:  Metastatic prostate cancer   Procedure(s) Performed:   1. Bilateral simple orchiectomy via scrotal approach 2. Transrectal ultrasound guided prostate biopsy  Teaching Surgeon:  Dr. Irine Seal  Resident Surgeon:  Jonna Clark, MD  Anesthesia:  General via endotracheal tube   Fluids:  See anesthesia record  Estimated blood loss:  5 mL  Specimens:   1) Right base prostate biopsy 2) Right mid prostate biopsy 3) Right apex prostate biopsy 4) Left base prostate biopsy 5) Left apex prostate biopsy 6) Left mid prostate biopsy 7) Bilateral testicles   Cultures:  None  Drains:  None  Complications:  None   Indications: 83 y.o. male with history of fatigue, who was noted to have significant PSA elevation (85). CT demonstrated an larged prostate with bone, nodal and diffuse hepatic metastatic disease, concerning for metastatic prostate cancer.  He presents today for transrectal ultrasound guided prostate biopsy, frozen section, and bilateral simple orchiectomy via scrotal approach if frozen section positive. Risks & benefits of the procedure discussed with the patient, who wishes to proceed.  Findings:  DRE revealed a firm, multinodular, fixed prostate. 6 core transrectal ultrasound guided prostate biopsy performed. Frozen section of right base and mid positive for malignancy. Bilateral simple orchiectomy performed.   Description:  The patient was correctly identified in the preop holding area where written informed consent as well potential risk and complication reviewed. He agreed. They were brought back to the operative suite where a preinduction timeout was performed. Once correct information was verified, general anesthesia was induced via endotracheal tube. They were then gently placed into dorsal lithotomy position with SCDs in place for VTE prophylaxis. They were  prepped and draped in the usual sterile fashion and given appropriate preoperative antibiotics with gentamicin and ceftriaxone. A second timeout was then performed.   Prostate Biopsy A rectal exam was performed, revealing the findings as described above. We proceeded to insert the ultrasound probe per rectum with adequate lubrication. Volume measurements were taken of the prostate, with total volume revealed to be 108cc. Several hypoechoic lesions were noted, particularly in the left base and mid section. Next, 6 core biopsy was performed in standard fashion, with cores obtained in the right mid, base and apex as well as the left mid, base and apex. These were sent for frozen section evaluation.   Frozen section returned positive for malignancy, and we proceeded with simple bilateral orchiectomy.   Bilateral simple orchiectomy  We began by making a 6 cm incision along the median raphe in the midline scrotum. Electrocautery and blunt dissection was used to dissect through subcutaneous tissue, dartos layer, and the tunica vaginalis.  The testicle was delivered through the scrotal incision. Several cremasteric and gubernacular attachments were ligated with electrocautery. The cord was isolated above the epididymis and separated into 2 packets with vas deferens in one packet and testicular vessels in the other. Each packet was individually suture ligated x2 prior to separation from the testicle.   Both the right and left testicles were completed in the same manner through the single mid-line incision.   The remaining scrotal contents were inspected and noted to be normal. Excellent hemostasis was achieved with electrocautery.  A two-layer closure was performed with 3-0 chromic sutures to close Dartos in a running fashion. The skin was closed with running 4-0 monocryl sutures. Hemostasis was achieved with Bovie electrocautery.     Scrotal fluffs and  support were placed and the patient was awoken from  anesthesia and taken to the recovery area in stable condition.    All sponge and needle counts were correct x2.    Post Op Plan:   1. Discharge to home when meets PACU criteria. 2. F/u in 1-2 weeks for post operative check.    Attending Attestation: Dr. Jeffie Pollock was present and scrubbed for the entirety of the procedure

## 2016-11-25 NOTE — Anesthesia Preprocedure Evaluation (Addendum)
Anesthesia Evaluation  Patient identified by MRN, date of birth, ID band Patient awake    Reviewed: Allergy & Precautions, NPO status , Patient's Chart, lab work & pertinent test results  Airway Mallampati: II  TM Distance: >3 FB Neck ROM: Full    Dental  (+) Edentulous Upper, Edentulous Lower   Pulmonary former smoker,    breath sounds clear to auscultation       Cardiovascular hypertension, Pt. on medications  Rhythm:Regular Rate:Normal     Neuro/Psych Depression negative neurological ROS     GI/Hepatic Neg liver ROS, GERD  Medicated,  Endo/Other  diabetes, Type 2  Renal/GU negative Renal ROS     Musculoskeletal   Abdominal   Peds  Hematology negative hematology ROS (+)   Anesthesia Other Findings   Reproductive/Obstetrics                           Lab Results  Component Value Date   WBC 9.4 09/16/2016   HGB 9.5 (L) 09/16/2016   HCT 30.2 (L) 09/16/2016   MCV 69.7 Repeated and verified X2. (L) 09/16/2016   PLT 451.0 (H) 09/16/2016   Lab Results  Component Value Date   CREATININE 1.38 10/15/2016   BUN 24 (H) 10/15/2016   NA 132 (L) 10/15/2016   K 4.9 10/15/2016   CL 97 10/15/2016   CO2 26 10/15/2016    Anesthesia Physical Anesthesia Plan  ASA: III  Anesthesia Plan: General   Post-op Pain Management:    Induction: Intravenous  PONV Risk Score and Plan: 3 and Ondansetron, Dexamethasone and Treatment may vary due to age or medical condition  Airway Management Planned: LMA  Additional Equipment:   Intra-op Plan:   Post-operative Plan: Extubation in OR  Informed Consent: I have reviewed the patients History and Physical, chart, labs and discussed the procedure including the risks, benefits and alternatives for the proposed anesthesia with the patient or authorized representative who has indicated his/her understanding and acceptance.   Dental advisory given  Plan  Discussed with: CRNA  Anesthesia Plan Comments:        Anesthesia Quick Evaluation

## 2016-11-26 ENCOUNTER — Encounter (HOSPITAL_COMMUNITY): Payer: Self-pay | Admitting: Urology

## 2016-11-26 LAB — GLUCOSE, CAPILLARY
GLUCOSE-CAPILLARY: 36 mg/dL — AB (ref 65–99)
GLUCOSE-CAPILLARY: 110 mg/dL — AB (ref 65–99)
Glucose-Capillary: 181 mg/dL — ABNORMAL HIGH (ref 65–99)

## 2016-11-27 LAB — GLUCOSE, CAPILLARY: Glucose-Capillary: 35 mg/dL — CL (ref 65–99)

## 2016-12-01 ENCOUNTER — Ambulatory Visit (INDEPENDENT_AMBULATORY_CARE_PROVIDER_SITE_OTHER): Payer: Medicare HMO | Admitting: Family Medicine

## 2016-12-01 ENCOUNTER — Encounter: Payer: Self-pay | Admitting: *Deleted

## 2016-12-01 ENCOUNTER — Encounter: Payer: Self-pay | Admitting: Family Medicine

## 2016-12-01 VITALS — BP 100/64 | HR 120 | Temp 97.9°F | Wt 148.1 lb

## 2016-12-01 DIAGNOSIS — R17 Unspecified jaundice: Secondary | ICD-10-CM | POA: Insufficient documentation

## 2016-12-01 DIAGNOSIS — IMO0001 Reserved for inherently not codable concepts without codable children: Secondary | ICD-10-CM

## 2016-12-01 DIAGNOSIS — E162 Hypoglycemia, unspecified: Secondary | ICD-10-CM

## 2016-12-01 DIAGNOSIS — R634 Abnormal weight loss: Secondary | ICD-10-CM | POA: Diagnosis not present

## 2016-12-01 DIAGNOSIS — E1165 Type 2 diabetes mellitus with hyperglycemia: Secondary | ICD-10-CM | POA: Diagnosis not present

## 2016-12-01 LAB — POCT GLUCOSE (DEVICE FOR HOME USE): POC GLUCOSE: 95 mg/dL (ref 70–99)

## 2016-12-01 NOTE — Progress Notes (Signed)
Subjective:     Patient ID: Adrian Wood, male   DOB: 04-01-1927, 83 y.o.   MRN: [id number]  HPI Patient has had general decline in recent months with substantial weight loss and decline in appetite. On further evaluation very high PSA and has seen urologist recently with biopsy which confirmed prostate cancer. They're contemplating next steps at this time.   He has evidence for metastatic disease to liver and probably spine by CT.    He has type 2 diabetes with generally good control. Most recent A1c 6.8%. He's lost another 7 pounds since last visit here. Declining intake. Has had 2 readings recently below 50 including of 45 and 49. Currently is on metformin 500 mgs twice daily and glimepiride 4 mg daily. He also takes HCTZ and losartan for hypertension. Occasional lightheadedness. No syncope.  No fevers or chills. He has some nausea but no vomiting. No abdominal pain.  Past Medical History:  Diagnosis Date  . ABNORMAL CHEST XRAY 12/10/2009  . Borderline hypertension   . DEPRESSION 11/09/2008  . DIAB W/O COMP TYPE II/UNS NOT STATED UNCNTRL 11/09/2008  . GERD (gastroesophageal reflux disease)   . History of gastric ulcer   . HYPERLIPIDEMIA 05/24/2010  . Hypertension   . Microscopic hematuria 11/16/2009  . Nonspecific (abnormal) findings on radiological and other examination of body structure 12/10/2009   Past Surgical History:  Procedure Laterality Date  . EYE SURGERY     bilateral cataract with lens implants  . ORCHIECTOMY Bilateral 11/25/2016   Procedure: BILATERAL SCROTAL ORCHIECTOMY;  Surgeon: Irine Seal, MD;  Location: [facility];  Service: Urology;  Laterality: Bilateral;  . PROSTATE BIOPSY N/A 11/25/2016   Procedure: BIOPSY TRANSRECTAL ULTRASONIC PROSTATE (TUBP) WITH FROZEN SECTION ;  Surgeon: Irine Seal, MD;  Location: [facility];  Service: Urology;  Laterality: N/A;    reports that he quit smoking about 34 years ago. His smoking use included Cigarettes. He has a 30.00 pack-year smoking  history. He has never used smokeless tobacco. He reports that he drinks about 1.2 oz of alcohol per week . He reports that he does not use drugs. family history includes Heart attack in his father; Lung cancer in his sister. Allergies  Allergen Reactions  . Amlodipine Swelling     Review of Systems  Constitutional: Positive for appetite change, fatigue and unexpected weight change. Negative for fever.  Respiratory: Negative for shortness of breath.   Cardiovascular: Negative for chest pain.       Objective:   Physical Exam  Constitutional:  Patient has some obvious scleral icterus and jaundice involving his head and neck region. Alert in no distress  Eyes: Scleral icterus is present.  Cardiovascular: Normal rate and regular rhythm.   Pulmonary/Chest: Effort normal and breath sounds normal. No respiratory distress. He has no wheezes. He has no rales.  Musculoskeletal: He exhibits no edema.  Neurological: He is alert.       Assessment:     #1 metastatic adenocarcinoma of the prostate  #2 type 2 diabetes with recent hypoglycemic episodes  #3 hypertension well controlled and at risk for hypotension  #4 jaundice probably related to metastatic disease  #5 recent mild cognitive impairment-?related to hypoglycemia, ?metastatic disease, ?hepatic encephalopathy      Plan:     -Discontinue glimepiride, HCTZ, and pravastatin -We'll call wife later today when she is back at home to discuss goals of care. She did not wish to discuss in front of him at this time. -I called his wife after  she returned home.  She is definitely leaning toward palliative care -consider ammonia levels and repeat electrolytes for any progressive cognitive decline.  Eulas Post MD [facility]

## 2016-12-01 NOTE — Patient Instructions (Signed)
STOP the Glimepiride  STOP the HCTZ 12.5 mg

## 2016-12-03 ENCOUNTER — Telehealth: Payer: Self-pay | Admitting: Family Medicine

## 2016-12-03 DIAGNOSIS — C61 Malignant neoplasm of prostate: Secondary | ICD-10-CM | POA: Diagnosis not present

## 2016-12-03 NOTE — Telephone Encounter (Signed)
OK to start back -unless otherwise instructed by Dr Jeffie Pollock.

## 2016-12-03 NOTE — Telephone Encounter (Signed)
Patient is having difficulty sleeping at night.  Wife would like to know if it is okay to take Aleve PM?

## 2016-12-03 NOTE — Telephone Encounter (Signed)
yes

## 2016-12-03 NOTE — Telephone Encounter (Signed)
° ° ° ° °  Pt wife called and would like to know if pt can start back taking aspirin again. Would like a call back

## 2016-12-04 NOTE — Telephone Encounter (Signed)
I left a detailed message with the information below at the pts cell number. 

## 2016-12-05 ENCOUNTER — Telehealth: Payer: Self-pay | Admitting: Family Medicine

## 2016-12-05 ENCOUNTER — Encounter: Payer: Self-pay | Admitting: Family Medicine

## 2016-12-05 MED ORDER — ZOLPIDEM TARTRATE 5 MG PO TABS: 5.0000 mg | ORAL_TABLET | Freq: Every evening | ORAL | 0 refills | Status: AC | PRN

## 2016-12-05 NOTE — Telephone Encounter (Signed)
PA submitted. Key: [id number]

## 2016-12-05 NOTE — Telephone Encounter (Signed)
Wife states the [place] needs Rx needs a prior [place]. Wife states pt has not slept in several days and this is urgent for him to get this med. Would like our help please.    [place], [place]

## 2016-12-05 NOTE — Telephone Encounter (Signed)
Advised wife PA has been done, awaiting authorization. Also, if is she wants to buy a couple of tbs to get her through, this would be an option. She verbalized understanding, and will go to the pharmacy. Also await PA.

## 2016-12-08 ENCOUNTER — Telehealth: Payer: Self-pay | Admitting: Family Medicine

## 2016-12-08 ENCOUNTER — Telehealth: Payer: Self-pay

## 2016-12-08 NOTE — Telephone Encounter (Signed)
Pt has an appt tomorrow at 115 pm

## 2016-12-08 NOTE — Telephone Encounter (Signed)
-----   Message from Milus Banister, MD sent at 12/08/2016  7:20 AM EDT ----- Cbc is much improved.  Hb normal now.  He needs to continue once daily iron supplement, indefinitely  Thanks  dj  ----- Message ----- From: Jeoffrey Massed, RN Sent: 12/02/2016   8:40 AM To: Milus Banister, MD

## 2016-12-08 NOTE — Telephone Encounter (Signed)
PA approved, form faxed back to pharmacy. 

## 2016-12-08 NOTE — Telephone Encounter (Signed)
pts wife is calling needing a call back to discuss a personal issue.

## 2016-12-09 ENCOUNTER — Encounter: Payer: Self-pay | Admitting: Family Medicine

## 2016-12-09 ENCOUNTER — Ambulatory Visit (INDEPENDENT_AMBULATORY_CARE_PROVIDER_SITE_OTHER): Payer: Medicare HMO | Admitting: Family Medicine

## 2016-12-09 VITALS — BP 102/76 | HR 113 | Temp 97.5°F | Wt 144.5 lb

## 2016-12-09 DIAGNOSIS — E1165 Type 2 diabetes mellitus with hyperglycemia: Secondary | ICD-10-CM | POA: Diagnosis not present

## 2016-12-09 DIAGNOSIS — C61 Malignant neoplasm of prostate: Secondary | ICD-10-CM

## 2016-12-09 DIAGNOSIS — R17 Unspecified jaundice: Secondary | ICD-10-CM

## 2016-12-09 DIAGNOSIS — IMO0001 Reserved for inherently not codable concepts without codable children: Secondary | ICD-10-CM

## 2016-12-09 LAB — CBC WITH DIFFERENTIAL/PLATELET
Basophils Relative: 0 % (ref 0.0–3.0)
EOS PCT: 0 % (ref 0.0–5.0)
HCT: 37.9 % — ABNORMAL LOW (ref 39.0–52.0)
HEMOGLOBIN: 12.2 g/dL — AB (ref 13.0–17.0)
Lymphocytes Relative: 8 % — ABNORMAL LOW (ref 12.0–46.0)
MCHC: 32.2 g/dL (ref 30.0–36.0)
MCV: 90.7 fl (ref 78.0–100.0)
MONOS PCT: 9 % (ref 3.0–12.0)
Neutrophils Relative %: 83 % — ABNORMAL HIGH (ref 43.0–77.0)
Platelets: 447 10*3/uL — ABNORMAL HIGH (ref 150.0–400.0)
RBC: 4.17 Mil/uL — AB (ref 4.22–5.81)
RDW: 21.3 % — ABNORMAL HIGH (ref 11.5–15.5)
WBC: 11.2 10*3/uL — ABNORMAL HIGH (ref 4.0–10.5)

## 2016-12-09 LAB — COMPREHENSIVE METABOLIC PANEL
ALT: 121 U/L — ABNORMAL HIGH (ref 0–53)
AST: 276 U/L — AB (ref 0–37)
Albumin: 3.1 g/dL — ABNORMAL LOW (ref 3.5–5.2)
Alkaline Phosphatase: 1386 U/L — ABNORMAL HIGH (ref 39–117)
BUN: 31 mg/dL — ABNORMAL HIGH (ref 6–23)
CALCIUM: 9.7 mg/dL (ref 8.4–10.5)
CHLORIDE: 92 meq/L — AB (ref 96–112)
CO2: 23 meq/L (ref 19–32)
CREATININE: 1.22 mg/dL (ref 0.40–1.50)
GFR: 59.54 mL/min — ABNORMAL LOW (ref >60.00)
GLUCOSE: 177 mg/dL — AB (ref 70–99)
Potassium: 4.9 mEq/L (ref 3.5–5.1)
SODIUM: 124 meq/L — AB (ref 135–145)
Total Bilirubin: 13.4 mg/dL — ABNORMAL HIGH (ref 0.2–1.2)
Total Protein: 5.7 g/dL — ABNORMAL LOW (ref 6.0–8.3)

## 2016-12-09 LAB — POCT GLUCOSE (DEVICE FOR HOME USE): POC GLUCOSE: 200 mg/dL — AB (ref 70–99)

## 2016-12-09 LAB — AMMONIA: Ammonia: 72 umol/L — ABNORMAL HIGH (ref 11–35)

## 2016-12-09 NOTE — Patient Instructions (Addendum)
HOLD Losartan for now.   Reduce the Metformin to one tablet twice daily.

## 2016-12-09 NOTE — Telephone Encounter (Signed)
The pt has been advised and will continue Iron daily

## 2016-12-09 NOTE — Progress Notes (Signed)
Subjective:     Patient ID: Adrian Wood, male   DOB: [birth year], 83 y.o.   MRN: [id number]  HPI  Patient has metastatic prostate cancer and continues to decline. He's had orchiectomy and saw urology recently and PSA had dropped from 85-50. He was noted to be jaundiced last week. CT back in June showed evidence for metastatic disease in the liver-with no biliary dilatation.Marland Kitchen His appetite has continued to decline. We discontinued his Amaryl last visit secondary to hypoglycemia. Blood sugars have improved with no hypoglycemia since then. They're using Glucerna as a supplement. He has occasional nausea but no vomiting.  Next follow up with Urology is November. I talked to this wife over the phone about possible Palliative care referral and she does express interest.  He has some bilateral leg pains and some recent lightheadedness. No syncope. Feels weak overall. Some abdominal bloating. No vomiting.  No fever or dysuria.    Past Medical History:  Diagnosis Date  . ABNORMAL CHEST XRAY 12/10/2009  . Borderline hypertension   . DEPRESSION 11/09/2008  . DIAB W/O COMP TYPE II/UNS NOT STATED UNCNTRL 11/09/2008  . GERD (gastroesophageal reflux disease)   . History of gastric ulcer   . HYPERLIPIDEMIA 05/24/2010  . Hypertension   . Microscopic hematuria 11/16/2009  . Nonspecific (abnormal) findings on radiological and other examination of body structure 12/10/2009   Past Surgical History:  Procedure Laterality Date  . EYE SURGERY     bilateral cataract with lens implants  . ORCHIECTOMY Bilateral 11/25/2016   Procedure: BILATERAL SCROTAL ORCHIECTOMY;  Surgeon: Irine Seal, MD;  Location: [facility];  Service: Urology;  Laterality: Bilateral;  . PROSTATE BIOPSY N/A 11/25/2016   Procedure: BIOPSY TRANSRECTAL ULTRASONIC PROSTATE (TUBP) WITH FROZEN SECTION ;  Surgeon: Irine Seal, MD;  Location: [facility];  Service: Urology;  Laterality: N/A;    reports that he quit smoking about 34 years ago. His smoking use  included Cigarettes. He has a 30.00 pack-year smoking history. He has never used smokeless tobacco. He reports that he drinks about 1.2 oz of alcohol per week . He reports that he does not use drugs. family history includes Heart attack in his father; Lung cancer in his sister. Allergies  Allergen Reactions  . Amlodipine Swelling    Review of Systems  Constitutional: Positive for appetite change and fatigue. Negative for chills and fever.  Eyes: Negative for visual disturbance.  Respiratory: Negative for cough, chest tightness and shortness of breath.   Cardiovascular: Negative for chest pain, palpitations and leg swelling.  Gastrointestinal: Positive for nausea. Negative for vomiting.  Skin: Positive for color change.  Neurological: Negative for dizziness, syncope, weakness, light-headedness and headaches.       Objective:   Physical Exam  Eyes:  Scleral icterus bilaterally  Cardiovascular: Normal rate and regular rhythm.   Pulmonary/Chest: Effort normal and breath sounds normal. No respiratory distress. He has no wheezes. He has no rales.  Abdominal:  Abdomen is somewhat distended but normal bowel sounds and nontender  Musculoskeletal: He exhibits no edema.  Skin:  Patient is very icteric including legs       Assessment:     Patient has metastatic prostate cancer with evidence for metastases in the liver. He had recent prostate biopsy confirming prostate cancer along with orchiectomy. He's had progressive decline and now has increasing jaundice-rule out biliary obstruction.    Plan:     -Repeat labs with CBC, comprehensive metabolic panel, ammonia level -We'll discuss his care further with urology -  Long talk with wife regarding goals of care. At this point would strongly favor hospice or palliative care and she agrees with palliative care consult -She will continue Ambien as needed for insomnia at night -check abdominal ultrasound to rule out biliary dilatation. -Hold  Losartan at this time secondary to low BP. -reduce Metformin to 500 mg po bid  Eulas Post MD [facility] significant for Na 124, T bili 13.4, alk phos 1,386, AST 276, ALT 121 Discussed with Dr Jeffie Pollock.  ?hepatocellular necrosis from tumor vs other obstructive process.  Will check ultrasound.  Also discussed hyponatremia with his wife.  They would like to try to keep out of hospital at this time.  Wife agrees with Ultrasound.  Palliative Care consult has been placed.  Eulas Post MD [facility]

## 2016-12-11 ENCOUNTER — Ambulatory Visit
Admission: RE | Admit: 2016-12-11 | Discharge: 2016-12-11 | Disposition: A | Discharge status: Home / Self Care | Payer: Medicare HMO | Source: Ambulatory Visit | Attending: Family Medicine | Admitting: Family Medicine

## 2016-12-11 DIAGNOSIS — Z8546 Personal history of malignant neoplasm of prostate: Secondary | ICD-10-CM | POA: Diagnosis not present

## 2016-12-11 DIAGNOSIS — K7689 Other specified diseases of liver: Secondary | ICD-10-CM | POA: Diagnosis not present

## 2016-12-11 DIAGNOSIS — R17 Unspecified jaundice: Secondary | ICD-10-CM

## 2016-12-16 ENCOUNTER — Other Ambulatory Visit: Payer: Medicare HMO

## 2016-12-22 ENCOUNTER — Telehealth: Payer: Self-pay | Admitting: Family Medicine

## 2016-12-22 NOTE — Telephone Encounter (Signed)
FYIHorris Wood with [facility] call to inform the pt has passed away on 15-Jan-2017 at 9:23 a.m. at his home.

## 2016-12-27 DEATH — deceased

## 2017-01-15 ENCOUNTER — Encounter: Payer: Self-pay | Admitting: Family Medicine

## 2018-04-21 IMAGING — DX DG CHEST 2V
2 series · 2 of 2 positions shown · non-contrast
Comparison: 12/13/2009

CLINICAL DATA: Worsening shortness of breath and cough for 1 month

EXAM:
CHEST  2 VIEW

[chest pa]
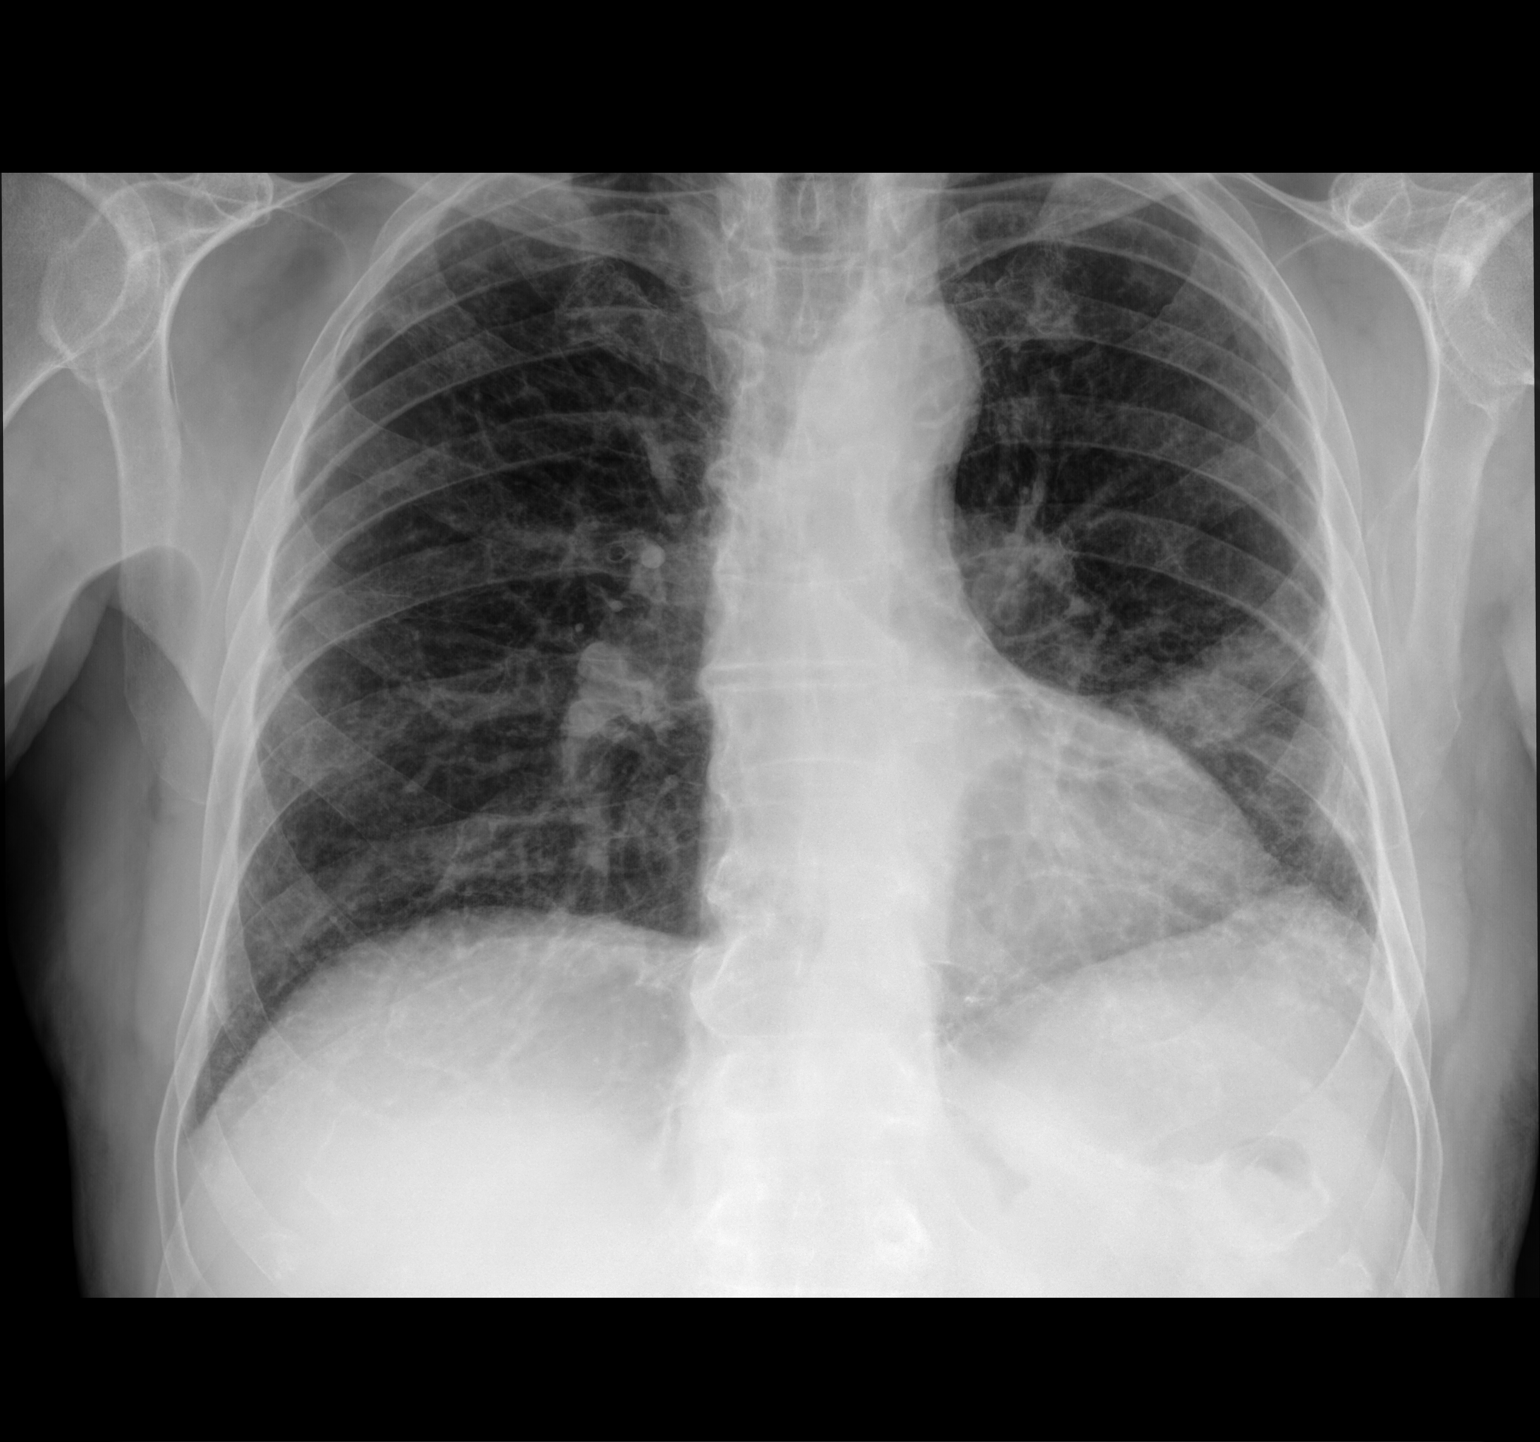

[chest lat]
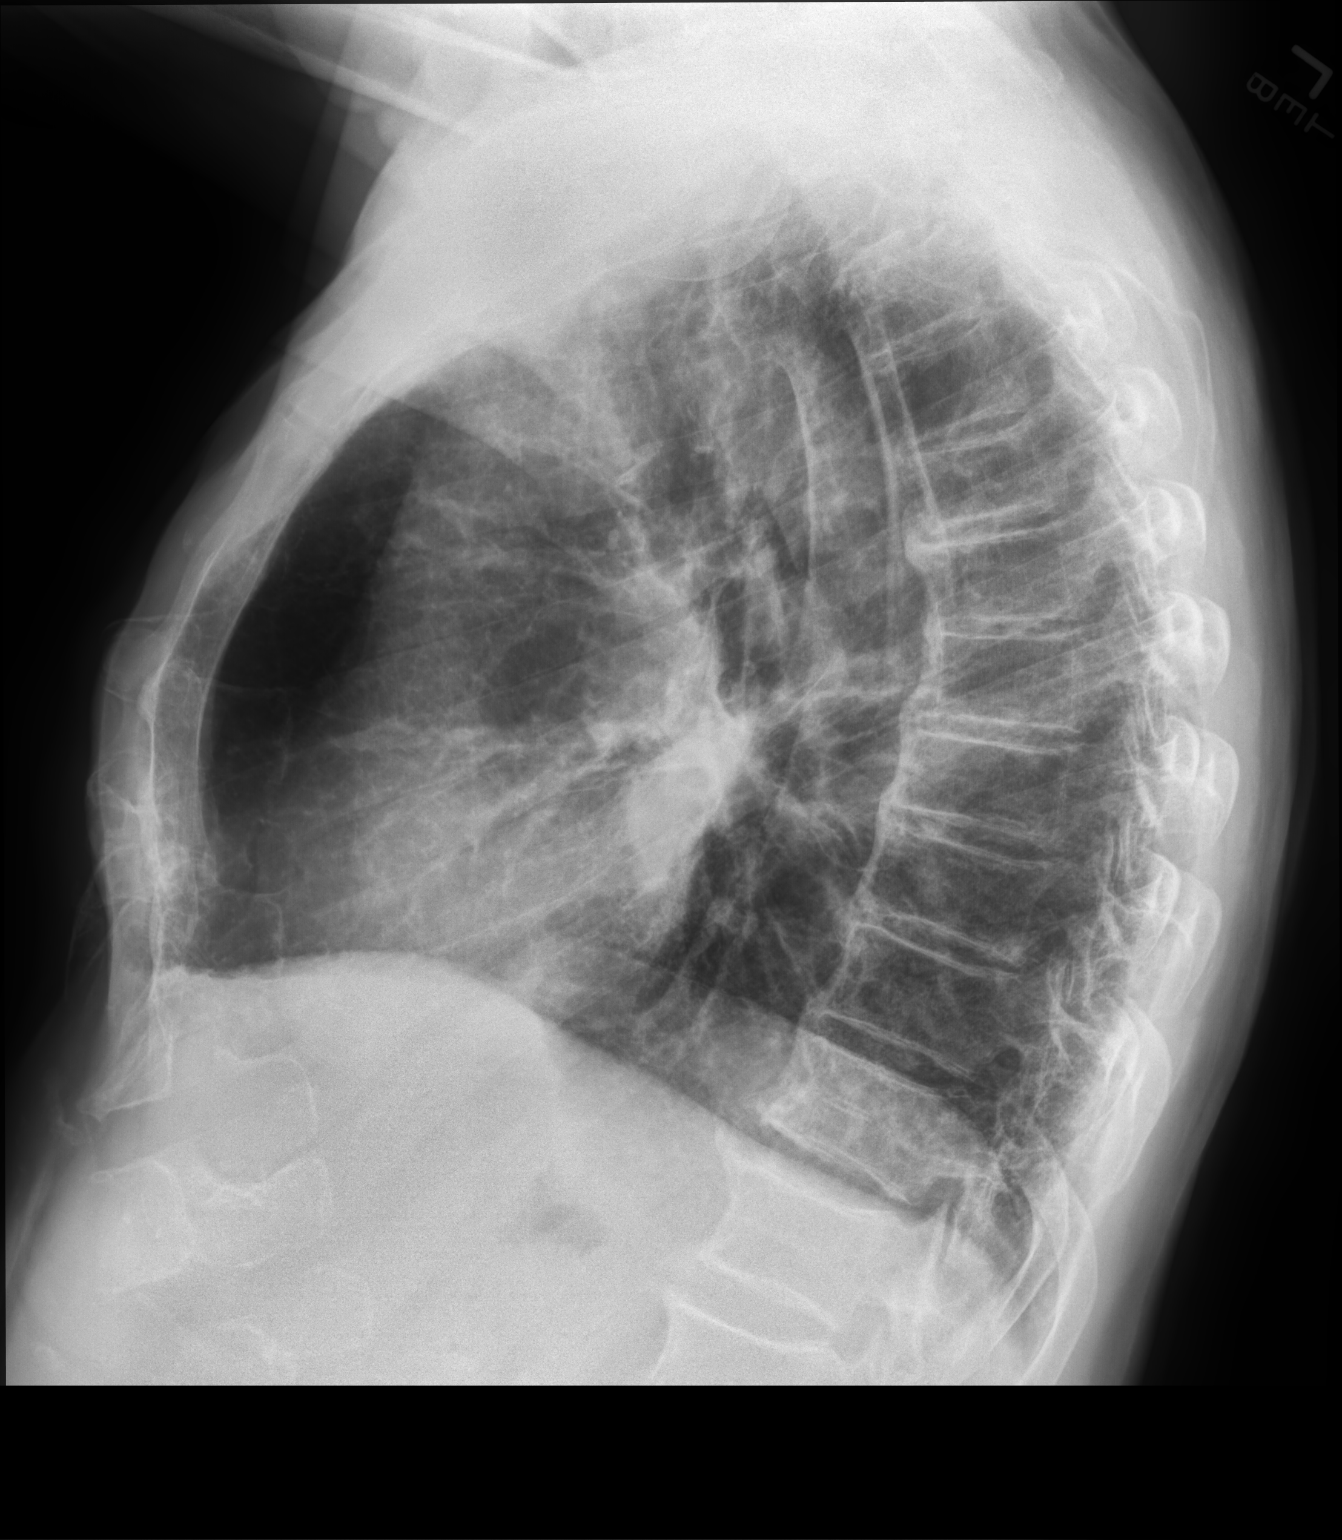

[2 of 2 positions shown; findings below may reference images not displayed]

FINDINGS: Cardiac shadow is stable. Increasing interstitial changes are noted
when compared with the prior exam. No focal confluent infiltrate is
seen. Degenerative changes of the thoracic spine are noted.
IMPRESSION: Increased interstitial changes when compared with the prior exam but
likely of a chronic nature. No acute infiltrate is seen.
